# Patient Record
Sex: Male | Born: 1985 | Hispanic: Yes | Marital: Married | Smoking: Never smoker
Health system: Southern US, Community
[De-identification: ages and names within clinical notes are randomized; demographics above are authoritative.]

## PROBLEM LIST (undated history)

## (undated) DIAGNOSIS — K219 Gastro-esophageal reflux disease without esophagitis: Secondary | ICD-10-CM

## (undated) DIAGNOSIS — K625 Hemorrhage of anus and rectum: Secondary | ICD-10-CM

## (undated) DIAGNOSIS — I10 Essential (primary) hypertension: Secondary | ICD-10-CM

## (undated) HISTORY — DX: Gastro-esophageal reflux disease without esophagitis: K21.9

## (undated) HISTORY — DX: Essential (primary) hypertension: I10

## (undated) HISTORY — DX: Hemorrhage of anus and rectum: K62.5

## (undated) HISTORY — PX: OTHER SURGICAL HISTORY: SHX169

---

## 2014-07-31 ENCOUNTER — Emergency Department (HOSPITAL_COMMUNITY)
Admission: EM | Admit: 2014-07-31 | Discharge: 2014-07-31 | Disposition: A | Discharge status: Home / Self Care | Payer: Self-pay | Attending: Emergency Medicine | Admitting: Emergency Medicine

## 2014-07-31 ENCOUNTER — Emergency Department (HOSPITAL_COMMUNITY): Payer: Self-pay

## 2014-07-31 ENCOUNTER — Encounter (HOSPITAL_COMMUNITY): Payer: Self-pay | Admitting: Emergency Medicine

## 2014-07-31 DIAGNOSIS — R071 Chest pain on breathing: Secondary | ICD-10-CM | POA: Insufficient documentation

## 2014-07-31 DIAGNOSIS — R079 Chest pain, unspecified: Secondary | ICD-10-CM | POA: Insufficient documentation

## 2014-07-31 DIAGNOSIS — M549 Dorsalgia, unspecified: Secondary | ICD-10-CM | POA: Insufficient documentation

## 2014-07-31 DIAGNOSIS — Z79899 Other long term (current) drug therapy: Secondary | ICD-10-CM | POA: Insufficient documentation

## 2014-07-31 DIAGNOSIS — R0789 Other chest pain: Secondary | ICD-10-CM

## 2014-07-31 LAB — I-STAT CHEM 8, ED
BUN: 14 mg/dL (ref 6–23)
Calcium, Ion: 1.23 mmol/L (ref 1.12–1.23)
Chloride: 102 mEq/L (ref 96–112)
Creatinine, Ser: 1 mg/dL (ref 0.50–1.35)
GLUCOSE: 95 mg/dL (ref 70–99)
HEMATOCRIT: 50 % (ref 39.0–52.0)
HEMOGLOBIN: 17 g/dL (ref 13.0–17.0)
POTASSIUM: 3.8 meq/L (ref 3.7–5.3)
SODIUM: 142 meq/L (ref 137–147)
TCO2: 24 mmol/L (ref 0–100)

## 2014-07-31 LAB — I-STAT TROPONIN, ED: TROPONIN I, POC: 0 ng/mL (ref 0.00–0.08)

## 2014-07-31 MED ORDER — IBUPROFEN 600 MG PO TABS: 600.0000 mg | ORAL_TABLET | Freq: Four times a day (QID) | ORAL | Status: DC | PRN

## 2014-07-31 MED ORDER — IBUPROFEN 800 MG PO TABS
800.0000 mg | ORAL_TABLET | Freq: Once | ORAL | Status: AC
  Administered 2014-07-31: 800 mg via ORAL
  Filled 2014-07-31: qty 1

## 2014-07-31 NOTE — ED Provider Notes (Signed)
CSN: [id number]     Arrival date & time 07/31/14  1143 History   First MD Initiated Contact with Patient 07/31/14 1429     Chief Complaint  Patient presents with  . Chest Pain   Jorge Randall is a 28 yo hispanic M who presents w/CP. Patient has had this chest pain for about a year and a half, located on the left side of his chest, worse with left arm movement. However patient endorses in the last 24 hours his pain has become much worse. Pain is sharp in nature and radiates to his back. Patient endorses that with deep inspiration he feels like the pain is worse. Left chest wall is tender to touch. No history of trauma or anything that would cause this pain. 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 He's tried Tylenol with minimal relief.  He denies SOB, cough, fever, chills, N/V, diarrhea, constipation, hematemesis, dysuria, hematuria, sick contacts, or recent travel.   (Consider location/radiation/quality/duration/timing/severity/associated sxs/prior Treatment) Patient is a 28 y.o. male presenting with chest pain.  Chest Pain Pain location:  L chest and L lateral chest Pain quality: aching and sharp   Radiates to: left back. Pain radiates to the back: yes   Pain severity:  Severe Onset quality:  Gradual Duration: 18 months. Timing:  Constant Progression:  Worsening Chronicity:  Chronic Context: breathing, lifting, movement, raising an arm and at rest   Context: no trauma   Relieved by:  Nothing Worsened by:  Deep breathing and movement Ineffective treatments:  Rest Associated symptoms: back pain   Associated symptoms: no abdominal pain, no altered mental status, no cough, no diaphoresis, no dizziness, no fever, no headache, no nausea, no near-syncope, no palpitations, no shortness of breath, not vomiting and no weakness     History reviewed. No pertinent past medical history. History reviewed. No pertinent past surgical  history. No family history on file. History  Substance Use Topics  . Smoking status: Never Smoker   . Smokeless tobacco: Not on file  . Alcohol Use: No    Review of Systems  Constitutional: Negative for fever, chills and diaphoresis.  Respiratory: Negative for cough and shortness of breath.   Cardiovascular: Positive for chest pain. Negative for palpitations, leg swelling and near-syncope.  Gastrointestinal: Negative for nausea, vomiting, abdominal pain, diarrhea, constipation and abdominal distention.  Genitourinary: Negative for dysuria, frequency, flank pain and decreased urine volume.  Musculoskeletal: Positive for back pain.  Neurological: Negative for dizziness, speech difficulty, weakness, light-headedness and headaches.  All other systems reviewed and are negative.     Allergies  Review of patient's allergies indicates no known allergies.  Home Medications   Prior to Admission medications   Medication Sig Start Date End Date Taking? Authorizing Provider  ibuprofen (ADVIL,MOTRIN) 600 MG tablet Take 1 tablet (600 mg total) by mouth every 6 (six) hours as needed for moderate pain. 07/31/14   Sherian Maroon, MD   BP 142/85  Pulse 67  Temp(Src) 98 F (36.7 C) (Oral)  Resp 20  SpO2 100% Physical Exam  Nursing note and vitals reviewed. Constitutional: He is oriented to person, place, and time. He appears well-developed and well-nourished. No distress.  HENT:  Head: Normocephalic and atraumatic.  Eyes: Pupils are equal, round, and reactive to light.  Neck: Normal range of motion.  Cardiovascular: Normal rate, regular rhythm, normal heart sounds and intact distal pulses.  Exam reveals no gallop and no friction rub.  No murmur heard. Pulmonary/Chest: Effort normal and breath sounds normal. No respiratory distress. He has no wheezes. He has no rales. He exhibits tenderness (left pectoralis major, left lateral rib, left posterior shoulder blade).  Abdominal: Soft. Bowel sounds  are normal. He exhibits no distension and no mass. There is no tenderness. There is no rebound and no guarding.  Musculoskeletal: Normal range of motion. He exhibits tenderness (left pectoralis major). He exhibits no edema.  Lymphadenopathy:    He has no cervical adenopathy.  Neurological: He is alert and oriented to person, place, and time. No cranial nerve deficit.  Skin: Skin is warm and dry. He is not diaphoretic.    ED Course  Procedures (including critical care time) [place], ED  I-STAT CHEM 8, ED    Imaging Review Dg Chest 2 View  07/31/2014   CLINICAL DATA:  Chest pain.  EXAM: CHEST  2 VIEW  COMPARISON:  No prior.  FINDINGS: Mediastinum and hilar structures normal. Lungs are clear. Heart size normal. No pleural effusion or pneumothorax. No acute bony abnormality.  IMPRESSION: No acute cardiopulmonary disease.   Electronically Signed   By: Marcello Moores  Register   On: 07/31/2014 12:22     EKG Interpretation None      MDM   28 year old Hispanic male here with chest pain. Please see history of present illness for details. On exam patient in NAD, AF VSS. Patient has tenderness to palpation over his left pectoralis major, left lateral ribs as well as posterior upper left back. There is no ecchymosis or erythema to the area, no swelling, no warmth. Suspect he has inflammation of the area. Remainder of exam benign. History and exam not c/w ACS, pericarditis, PTX, pancreatitis, AAA, aortic dissection, or PNA.  Troponin 0, lites within normal limits, chest x-ray shows no acute cardiopulmonary disease. EKG w/NSR and no signs of arrhythmia or ischemia, normal intervals.   Stable for discharge home with Motrin 600 800 mg every 6-8 hours as needed for the next several days. Followup as outpatient PCP. Strict return precautions include shortness of breath, severe chest pain, especially accompanied by nausea, vomiting, and diaphoresis.  Final diagnoses:   Chest wall pain    Pt was seen under the supervision of Dr. Jeanell Sparrow.     Sherian Maroon, MD 07/31/14 [phone]

## 2014-07-31 NOTE — Discharge Instructions (Signed)
Dolor de la pared torácica °(Chest Wall Pain) °Dolor en la pared torácica es dolor en o alrededor de los huesos y músculos de su pecho. Podrán pasar hasta 6 semanas hasta que comience a mejorar. Puede demorar más tiempo si es físicamente activo en su trabajo y actividades.  °CAUSAS  °El dolor en el pecho puede aparecer sin motivo. No obstante, algunas causas pueden ser:  °· Una enfermedad viral como la gripe. °· Traumatismos. °· Tos. °· La práctica de ejercicios. °· Artritis. °· Fibromialgia °· Culebrilla. °INSTRUCCIONES PARA EL CUIDADO DOMICILIARIO °· Evite hacer actividad física extenuante. Trate de no esforzarse o realizar actividades que le causen dolor. Aquí se incluyen las actividades en las que usa los músculos del tórax, los abdominales y los músculos laterales, especialmente si debe levantar objetos pesados. °· Aplique hielo sobre la zona dolorida. °¨ Ponga el hielo en una bolsa plástica. °¨ Colóquese una toalla entre la piel y la bolsa de hielo. °¨ Deje la bolsa de hielo durante 15 a 20 minutos por hora, durante los primeros 2 días. °· Utilice los medicamentos de venta libre o de prescripción para el dolor, el malestar o la fiebre, según se lo indique el profesional que lo asiste. °SOLICITE ATENCIÓN MÉDICA DE INMEDIATO SI: °· El dolor aumenta o siente muchas molestias. °· Tiene fiebre. °· El dolor de pecho empeora. °· Desarrolla nuevos e inexplicables síntomas. °· Tiene náuseas o vómitos. °· Transpira o se siente mareado. °· Tiene tos con flema (esputo), o tose con sangre. °ESTÉ SEGURO QUE:  °· Comprende las instrucciones para el alta médica. °· Controlará su enfermedad. °· Solicitará atención médica de inmediato según las indicaciones. °Document Released: 01/10/2007 Document Revised: 02/21/2012 °ExitCare® Patient Information ©2015 [employer]. This information is not intended to replace advice given to you by your health care provider. Make sure you discuss any questions you have with your health care  provider. ° °

## 2014-07-31 NOTE — ED Notes (Signed)
Pt continues to be monitored by blood pressure, 5 lead, and pulse ox.

## 2014-07-31 NOTE — ED Notes (Signed)
Pt presents to department for evaluation of L sided chest pain. Pt does not speak english, interpreter phone used. Pt reports L sided chest pain and L hand numbness, onset last night while at home. 5/10 pain upon arrival to ED. Respirations unlabored, skin warm and dry. NAD.

## 2014-08-01 NOTE — ED Provider Notes (Signed)
28 y.o. Male with left side chest pain that appears to be chest wall with reproducibility.    I performed a history and physical examination of Jorge Randall and discussed his management with Dr. Tamala Julian.  I agree with the history, physical, assessment, and plan of care, with the following exceptions: None  I was present for the following procedures: None Time Spent in Critical Care of the patient: None Time spent in discussions with the patient and family: 5  [name]    Shaune Pollack, MD 08/01/14 [phone]

## 2014-11-03 ENCOUNTER — Emergency Department (HOSPITAL_COMMUNITY)
Admission: EM | Admit: 2014-11-03 | Discharge: 2014-11-03 | Disposition: A | Discharge status: Home / Self Care | Payer: Self-pay | Attending: Emergency Medicine | Admitting: Emergency Medicine

## 2014-11-03 ENCOUNTER — Encounter (HOSPITAL_COMMUNITY): Payer: Self-pay | Admitting: *Deleted

## 2014-11-03 ENCOUNTER — Emergency Department (HOSPITAL_COMMUNITY): Payer: Self-pay

## 2014-11-03 DIAGNOSIS — Y9389 Activity, other specified: Secondary | ICD-10-CM | POA: Insufficient documentation

## 2014-11-03 DIAGNOSIS — H578 Other specified disorders of eye and adnexa: Secondary | ICD-10-CM | POA: Insufficient documentation

## 2014-11-03 DIAGNOSIS — L03115 Cellulitis of right lower limb: Secondary | ICD-10-CM

## 2014-11-03 DIAGNOSIS — Y998 Other external cause status: Secondary | ICD-10-CM | POA: Insufficient documentation

## 2014-11-03 DIAGNOSIS — R112 Nausea with vomiting, unspecified: Secondary | ICD-10-CM | POA: Insufficient documentation

## 2014-11-03 DIAGNOSIS — R42 Dizziness and giddiness: Secondary | ICD-10-CM | POA: Insufficient documentation

## 2014-11-03 DIAGNOSIS — M25569 Pain in unspecified knee: Secondary | ICD-10-CM

## 2014-11-03 DIAGNOSIS — Y9289 Other specified places as the place of occurrence of the external cause: Secondary | ICD-10-CM | POA: Insufficient documentation

## 2014-11-03 DIAGNOSIS — W1839XA Other fall on same level, initial encounter: Secondary | ICD-10-CM | POA: Insufficient documentation

## 2014-11-03 LAB — CBC WITH DIFFERENTIAL/PLATELET
BASOS PCT: 0 % (ref 0–1)
Basophils Absolute: 0 10*3/uL (ref 0.0–0.1)
Eosinophils Absolute: 0.5 10*3/uL (ref 0.0–0.7)
Eosinophils Relative: 5 % (ref 0–5)
HCT: 42.1 % (ref 39.0–52.0)
HEMOGLOBIN: 14.9 g/dL (ref 13.0–17.0)
Lymphocytes Relative: 20 % (ref 12–46)
Lymphs Abs: 1.9 10*3/uL (ref 0.7–4.0)
MCH: 29.6 pg (ref 26.0–34.0)
MCHC: 35.4 g/dL (ref 30.0–36.0)
MCV: 83.7 fL (ref 78.0–100.0)
MONOS PCT: 7 % (ref 3–12)
Monocytes Absolute: 0.7 10*3/uL (ref 0.1–1.0)
NEUTROS ABS: 6.4 10*3/uL (ref 1.7–7.7)
Neutrophils Relative %: 67 % (ref 43–77)
Platelets: 172 10*3/uL (ref 150–400)
RBC: 5.03 MIL/uL (ref 4.22–5.81)
RDW: 12.8 % (ref 11.5–15.5)
WBC: 9.5 10*3/uL (ref 4.0–10.5)

## 2014-11-03 LAB — I-STAT CHEM 8, ED
BUN: 14 mg/dL (ref 6–23)
Calcium, Ion: 1.14 mmol/L (ref 1.12–1.23)
Chloride: 103 mEq/L (ref 96–112)
Creatinine, Ser: 1 mg/dL (ref 0.50–1.35)
Glucose, Bld: 110 mg/dL — ABNORMAL HIGH (ref 70–99)
HEMATOCRIT: 47 % (ref 39.0–52.0)
HEMOGLOBIN: 16 g/dL (ref 13.0–17.0)
POTASSIUM: 3.4 meq/L — AB (ref 3.7–5.3)
Sodium: 141 mEq/L (ref 137–147)
TCO2: 23 mmol/L (ref 0–100)

## 2014-11-03 LAB — I-STAT CG4 LACTIC ACID, ED: Lactic Acid, Venous: 1.1 mmol/L (ref 0.5–2.2)

## 2014-11-03 MED ORDER — KETOROLAC TROMETHAMINE 30 MG/ML IJ SOLN
30.0000 mg | Freq: Once | INTRAMUSCULAR | Status: AC
  Administered 2014-11-03: 30 mg via INTRAVENOUS

## 2014-11-03 MED ORDER — METOCLOPRAMIDE HCL 5 MG/ML IJ SOLN
10.0000 mg | Freq: Once | INTRAMUSCULAR | Status: AC
  Administered 2014-11-03: 10 mg via INTRAVENOUS
  Filled 2014-11-03: qty 2

## 2014-11-03 MED ORDER — DIPHENHYDRAMINE HCL 50 MG/ML IJ SOLN
25.0000 mg | Freq: Once | INTRAMUSCULAR | Status: AC
  Administered 2014-11-03: 25 mg via INTRAVENOUS
  Filled 2014-11-03: qty 1

## 2014-11-03 MED ORDER — KETOROLAC TROMETHAMINE 30 MG/ML IJ SOLN
60.0000 mg | Freq: Once | INTRAMUSCULAR | Status: DC
  Filled 2014-11-03: qty 2

## 2014-11-03 MED ORDER — DEXAMETHASONE SODIUM PHOSPHATE 10 MG/ML IJ SOLN
10.0000 mg | Freq: Once | INTRAMUSCULAR | Status: AC
Start: 2014-11-03 — End: 2014-11-03
  Administered 2014-11-03: 10 mg via INTRAVENOUS
  Filled 2014-11-03: qty 1

## 2014-11-03 MED ORDER — SODIUM CHLORIDE 0.9 % IV BOLUS (SEPSIS)
1000.0000 mL | Freq: Once | INTRAVENOUS | Status: AC
  Administered 2014-11-03: 1000 mL via INTRAVENOUS

## 2014-11-03 MED ORDER — CEPHALEXIN 500 MG PO CAPS: 500.0000 mg | ORAL_CAPSULE | Freq: Four times a day (QID) | ORAL | Status: DC

## 2014-11-03 NOTE — ED Provider Notes (Signed)
CSN: [id number]     Arrival date & time 11/03/14  1706 History   First MD Initiated Contact with Patient 11/03/14 1731     Chief Complaint  Patient presents with  . Headache  . Vomiting     (Consider location/radiation/quality/duration/timing/severity/associated sxs/prior Treatment) HPI History is obtained via translator. Patient is a 28 year old male who presents complaining of right knee pain, chills nausea vomiting and headache. His that he fell and struck his right knee proximally 4 days ago. Since that time he has developed worsening pain in his right knee, worse when he moves it. He says 2 days ago he began feeling nauseated, and began vomiting. The headache as well which he describes as throbbing and bilateral. He feels weak and dizzy when he stands up. He has not noticed any fever, but has felt chills. He currently complains of 7 out of 10 pain in his right knee, worse with range of motion.   History reviewed. No pertinent past medical history. History reviewed. No pertinent past surgical history. No family history on file. History  Substance Use Topics  . Smoking status: Never Smoker   . Smokeless tobacco: Not on file  . Alcohol Use: No    Review of Systems  Constitutional: Positive for chills and fatigue. Negative for fever.  Respiratory: Negative for shortness of breath.   Musculoskeletal: Positive for joint swelling and arthralgias.  All other systems reviewed and are negative.     Allergies  Review of patient's allergies indicates no known allergies.  Home Medications   Prior to Admission medications   Medication Sig Start Date End Date Taking? Authorizing Provider  cephALEXin (KEFLEX) 500 MG capsule Take 1 capsule (500 mg total) by mouth 4 (four) times daily. 11/03/14   Leata Mouse, MD  ibuprofen (ADVIL,MOTRIN) 600 MG tablet Take 1 tablet (600 mg total) by mouth every 6 (six) hours as needed for moderate pain. 07/31/14   Sherian Maroon, MD   BP 138/79 mmHg   Pulse 75  Temp(Src) 98.5 F (36.9 C) (Oral)  Resp 19  Wt 184 lb (83.462 kg)  SpO2 99% Physical Exam  Constitutional: He appears well-developed and well-nourished. No distress.  HENT:  Head: Normocephalic and atraumatic.  Eyes: Conjunctivae and EOM are normal. Pupils are equal, round, and reactive to light. Left eye exhibits discharge.  Neck: Normal range of motion. Neck supple. No JVD present.  Cardiovascular: Normal rate, regular rhythm, normal heart sounds and intact distal pulses.   Pulmonary/Chest: Effort normal and breath sounds normal. No respiratory distress. He has no wheezes.  Abdominal: Soft. Bowel sounds are normal. He exhibits no distension. There is no tenderness.  Musculoskeletal:       Right knee: He exhibits swelling, erythema (mild erythema and induration surrounding a small scab overlying the tibial tuberosity) and bony tenderness. He exhibits normal range of motion and no deformity. No tenderness found.  Nursing note and vitals reviewed.   ED Course  Procedures (including critical care time) Labs Review Labs Reviewed  I-STAT CHEM 8, ED - Abnormal; Notable for the following:    Potassium 3.4 (*)    Glucose, Bld 110 (*)    All other components within normal limits  CBC WITH DIFFERENTIAL  I-STAT CG4 LACTIC ACID, ED    Imaging Review Dg Knee 2 Views Right  11/03/2014   CLINICAL DATA:  Anterior knee pain status post trauma.  EXAM: RIGHT KNEE - 1-2 VIEW  COMPARISON:  None.  FINDINGS: No displaced fracture or dislocation. No aggressive  osseous lesion. No joint effusion. No radiopaque foreign body.  IMPRESSION: No acute or aggressive osseous finding of the right knee.   Electronically Signed   By: Carlos Levering M.D.   On: 11/03/2014 19:32     EKG Interpretation None      MDM   Final diagnoses:  Knee pain  Cellulitis of right lower extremity    28 year old male presenting with cellulitis of the right knee. Normal vital signs, no fever, no tachycardia, no  leukocytosis, doubt serious systemic infection. Hence mild erythema and tenderness over the right tibial tuberosity. There is no sign of erosive disease, fracture or malalignment on x-ray.  He also complains of a headache associated with nausea. He has a normal neurologic exam, normal coordination extraocular movements intact, remainder cranial nerves unremarkable. Patient has a normal gait. Headache cocktail relieved his pain. No history of trauma to the head. Do not believe imaging is warranted at this time and there is no meningismus on exam.   We'll discharge with protection for Bactrim for cellulitis and return precautions given.   Leata Mouse, MD 11/04/14 [phone]

## 2014-11-03 NOTE — ED Notes (Signed)
The pt is c/o rt lower extremity pain for 4 days.  He denies any injury and points to the   Rt knee area and below.

## 2014-11-03 NOTE — ED Notes (Signed)
Pt taken to x-ray by Richardson Dopp.

## 2014-11-03 NOTE — ED Notes (Signed)
The pt speaks little english

## 2014-11-03 NOTE — ED Provider Notes (Signed)
I saw and evaluated the patient, reviewed the resident's note and I agree with the findings and plan.  Patient with mild right knee tenderness over the anterior tibial tuberosity only, with the right knee joint itself nontender.   Babette Relic, MD 11/07/14 [phone]

## 2014-11-03 NOTE — Discharge Instructions (Signed)
Celulitis (Celulitis)  La celulitis es una infeccin de la piel y del tejido que se encuentra debajo de la piel. El rea infectada generalmente est de color rojo y duele. Ocurre con ms frecuencia en los brazos y en las piernas. [place] los antibiticos tal como se le indic. Finalice el [place], aunque comience a Sports administrator.  Mantenga el brazo o la pierna infectada elevada.  Aplique paos calientes en la zona hasta 4 veces al da.  Tome slo los medicamentos que le haya indicado el mdico.  [place]umpla con los controles mdicos segn las indicaciones. SOLICITE AYUDA SI:  Observa una lnea roja en la piel que sale desde la herida.  El rea roja se extiende o se vuelve de color oscuro.  El hueso o la articulacin que se encuentran por debajo de la zona infectada le duelen despus de que la piel se [place].  La infeccin se repite en la misma zona o en una zona diferente.  Tiene un bulto inflamado (hinchado) en la zona infectada.  Aparecen nuevos sntomas.  Tiene fiebre. SOLICITE AYUDA DE INMEDIATO SI:   Se siente muy somnoliento.  Vomita o tiene diarrea.  Se siente enfermo y tiene [employer]. ASEGRESE DE QUE:   Comprende estas instrucciones.  Controlar su enfermedad.  Solicitar ayuda de inmediato si no mejora o si empeora. Document Released: 05/19/2010 Document Revised: 04/15/2014 [facility] Patient Information 2015 [place]. This information is not intended to replace advice given to you by your health care provider. Make sure you discuss any questions you have with your health care provider.

## 2015-11-12 IMAGING — CR DG KNEE 1-2V*R*
2 series · 2 of 2 positions shown · non-contrast
Comparison: None.

CLINICAL DATA: Anterior knee pain status post trauma.

EXAM:
RIGHT KNEE - 1-2 VIEW

[knee ap]
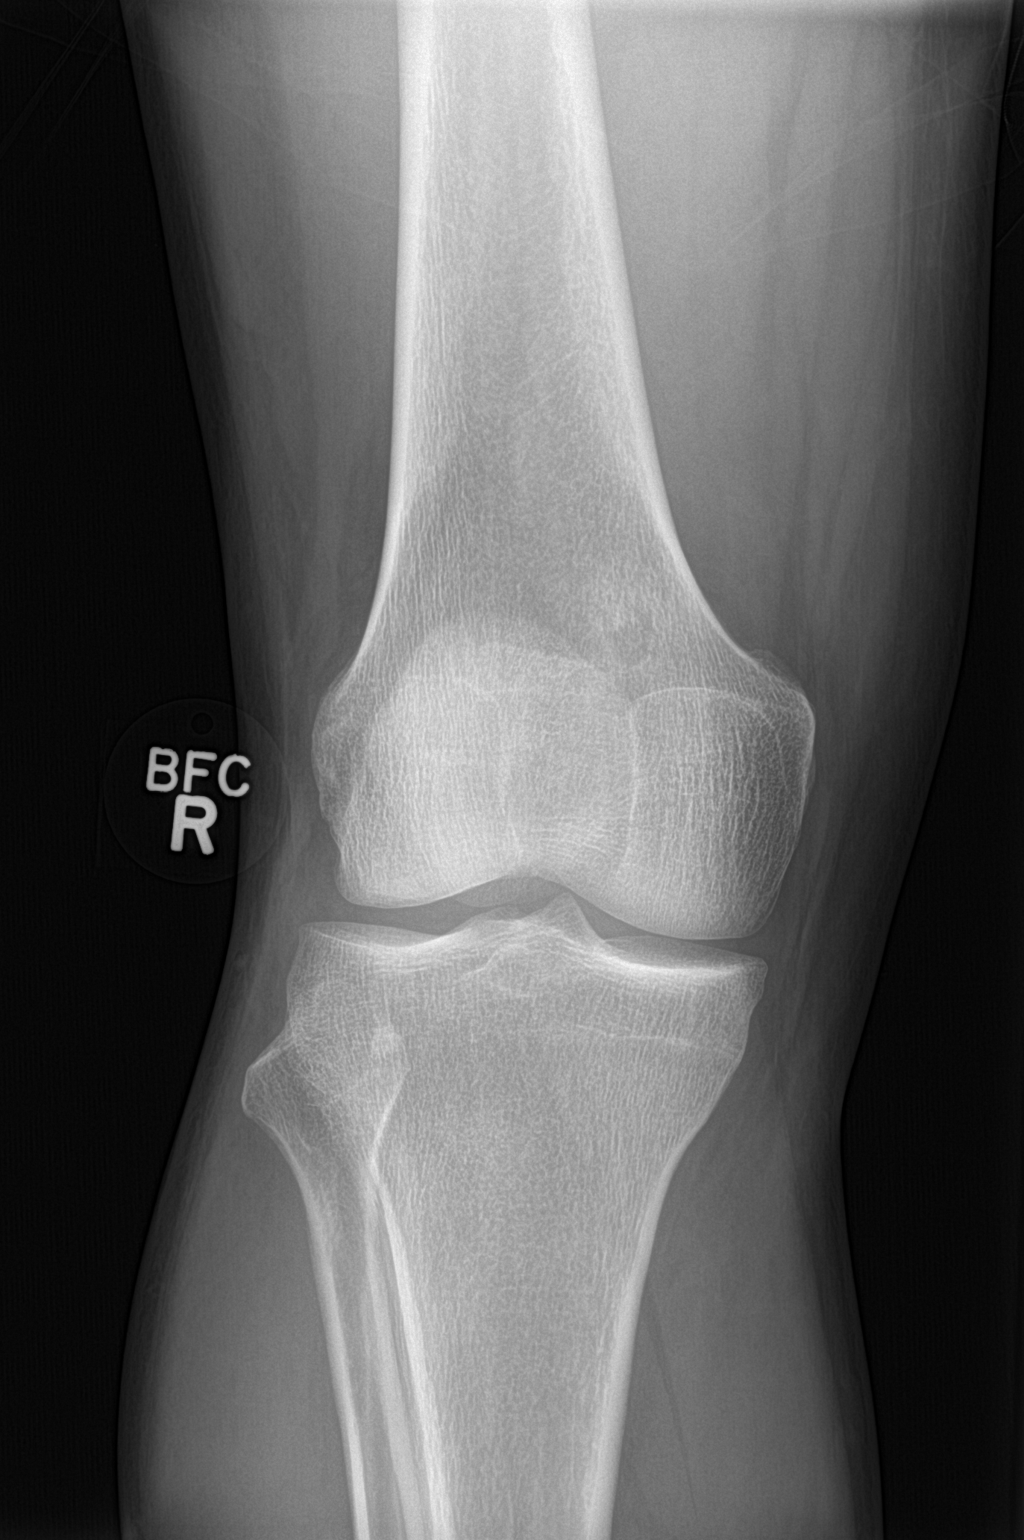

[knee lat]
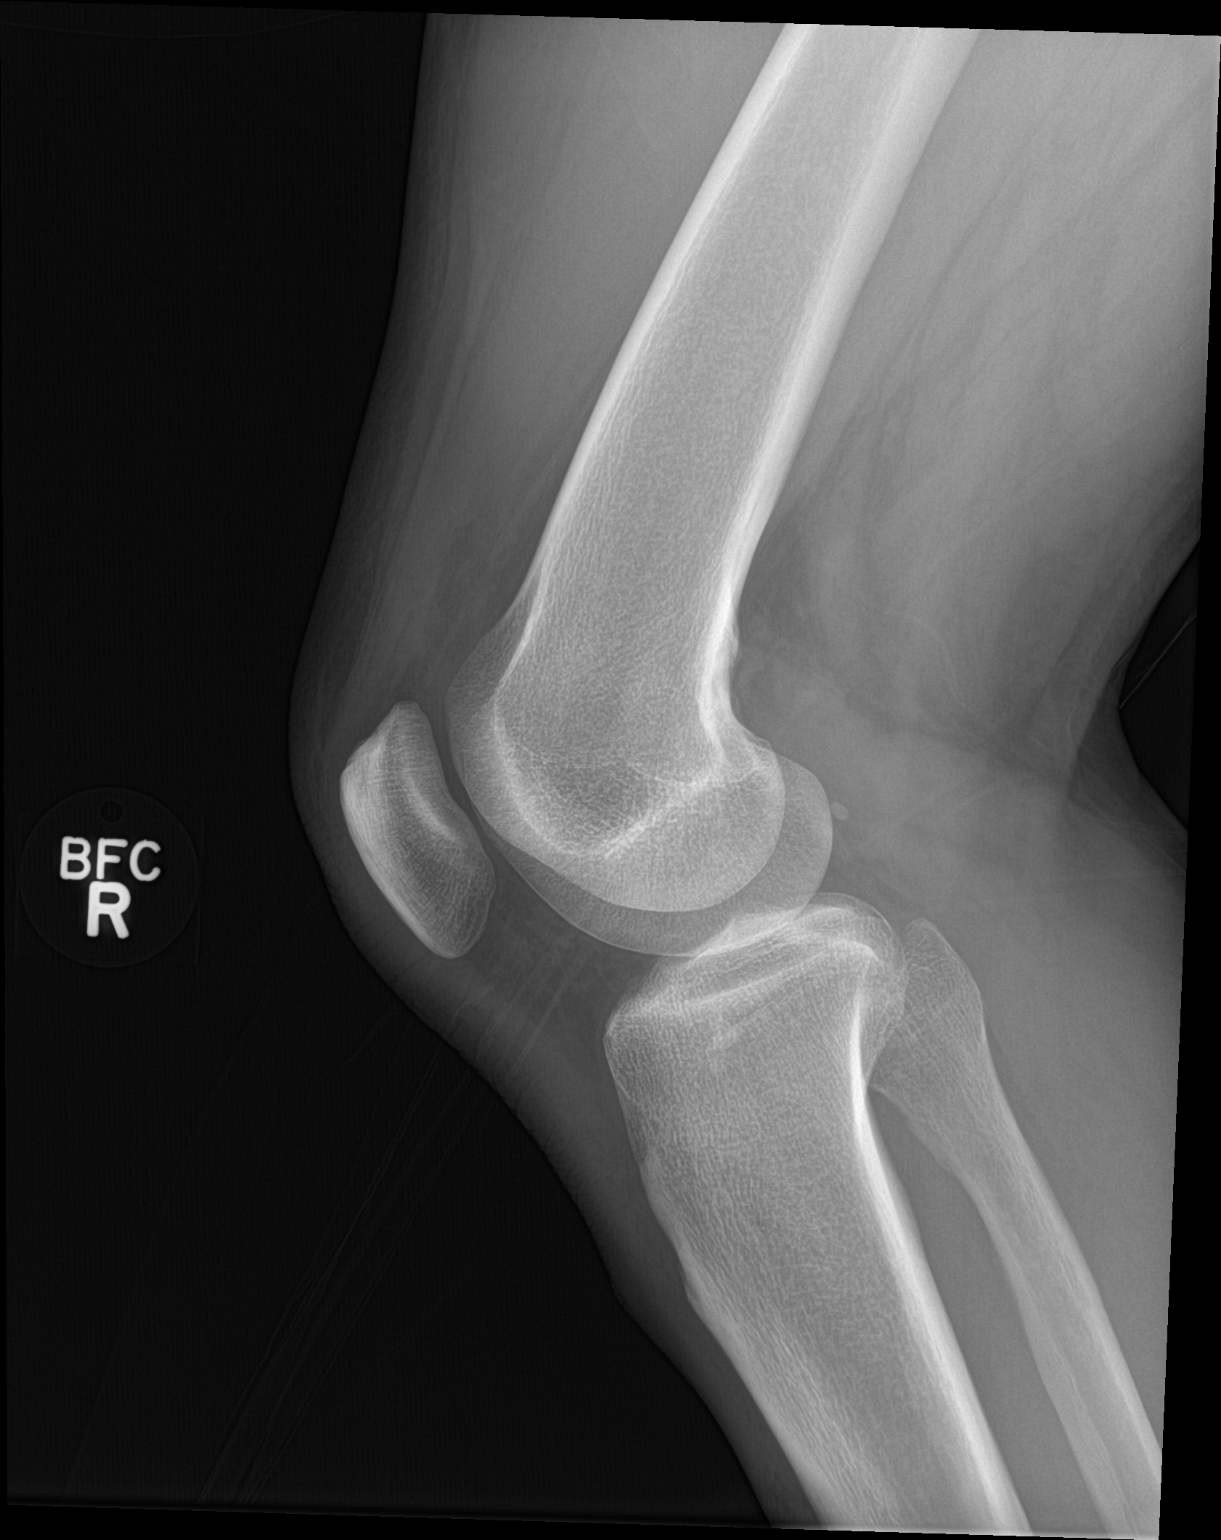

[2 of 2 positions shown; findings below may reference images not displayed]

FINDINGS: No displaced fracture or dislocation. No aggressive osseous lesion.
No joint effusion. No radiopaque foreign body.
IMPRESSION: No acute or aggressive osseous finding of the right knee.

## 2017-07-21 ENCOUNTER — Other Ambulatory Visit: Payer: Self-pay | Admitting: Nurse Practitioner

## 2017-07-21 DIAGNOSIS — R52 Pain, unspecified: Secondary | ICD-10-CM

## 2017-07-22 ENCOUNTER — Other Ambulatory Visit: Payer: Self-pay | Admitting: Nurse Practitioner

## 2017-07-22 DIAGNOSIS — R109 Unspecified abdominal pain: Secondary | ICD-10-CM

## 2017-07-25 ENCOUNTER — Other Ambulatory Visit: Payer: Self-pay

## 2017-07-25 ENCOUNTER — Other Ambulatory Visit: Payer: Self-pay | Admitting: Nurse Practitioner

## 2017-07-25 DIAGNOSIS — R1084 Generalized abdominal pain: Secondary | ICD-10-CM

## 2017-07-26 ENCOUNTER — Other Ambulatory Visit: Payer: No Typology Code available for payment source

## 2017-07-26 ENCOUNTER — Ambulatory Visit
Admission: RE | Admit: 2017-07-26 | Discharge: 2017-07-26 | Disposition: A | Discharge status: Home / Self Care | Payer: No Typology Code available for payment source | Source: Ambulatory Visit | Attending: Nurse Practitioner | Admitting: Nurse Practitioner

## 2017-07-26 DIAGNOSIS — R1084 Generalized abdominal pain: Secondary | ICD-10-CM

## 2017-07-28 ENCOUNTER — Encounter: Payer: Self-pay | Admitting: Gastroenterology

## 2017-08-03 ENCOUNTER — Emergency Department (HOSPITAL_COMMUNITY): Payer: Self-pay

## 2017-08-03 ENCOUNTER — Emergency Department (HOSPITAL_COMMUNITY)
Admission: EM | Admit: 2017-08-03 | Discharge: 2017-08-03 | Disposition: A | Discharge status: Home / Self Care | Payer: Self-pay | Attending: Physician Assistant | Admitting: Physician Assistant

## 2017-08-03 ENCOUNTER — Encounter (HOSPITAL_COMMUNITY): Payer: Self-pay | Admitting: Emergency Medicine

## 2017-08-03 DIAGNOSIS — K625 Hemorrhage of anus and rectum: Secondary | ICD-10-CM | POA: Insufficient documentation

## 2017-08-03 DIAGNOSIS — R109 Unspecified abdominal pain: Secondary | ICD-10-CM | POA: Insufficient documentation

## 2017-08-03 DIAGNOSIS — Z79899 Other long term (current) drug therapy: Secondary | ICD-10-CM | POA: Insufficient documentation

## 2017-08-03 LAB — COMPREHENSIVE METABOLIC PANEL
ALK PHOS: 70 U/L (ref 38–126)
ALT: 32 U/L (ref 17–63)
ANION GAP: 6 (ref 5–15)
AST: 33 U/L (ref 15–41)
Albumin: 4.1 g/dL (ref 3.5–5.0)
BUN: 15 mg/dL (ref 6–20)
CALCIUM: 9.2 mg/dL (ref 8.9–10.3)
CHLORIDE: 107 mmol/L (ref 101–111)
CO2: 27 mmol/L (ref 22–32)
CREATININE: 1.03 mg/dL (ref 0.61–1.24)
Glucose, Bld: 91 mg/dL (ref 65–99)
Potassium: 3.7 mmol/L (ref 3.5–5.1)
Sodium: 140 mmol/L (ref 135–145)
Total Bilirubin: 0.8 mg/dL (ref 0.3–1.2)
Total Protein: 8 g/dL (ref 6.5–8.1)

## 2017-08-03 LAB — CBC
HCT: 42.1 % (ref 39.0–52.0)
Hemoglobin: 15.3 g/dL (ref 13.0–17.0)
MCH: 29.8 pg (ref 26.0–34.0)
MCHC: 36.3 g/dL — ABNORMAL HIGH (ref 30.0–36.0)
MCV: 82.1 fL (ref 78.0–100.0)
PLATELETS: 167 10*3/uL (ref 150–400)
RBC: 5.13 MIL/uL (ref 4.22–5.81)
RDW: 12.7 % (ref 11.5–15.5)
WBC: 7.9 10*3/uL (ref 4.0–10.5)

## 2017-08-03 LAB — LIPASE, BLOOD: LIPASE: 43 U/L (ref 11–51)

## 2017-08-03 MED ORDER — IOPAMIDOL (ISOVUE-300) INJECTION 61%
100.0000 mL | Freq: Once | INTRAVENOUS | Status: AC | PRN
  Administered 2017-08-03: 100 mL via INTRAVENOUS

## 2017-08-03 NOTE — ED Provider Notes (Signed)
[facility] DEPT Provider Note   CSN: [id number] Arrival date & time: 08/03/17  1825     History   Chief Complaint Chief Complaint  Patient presents with  . Abdominal Pain  . Rectal Bleeding    HPI Jorge Randall is a 31 y.o. male.  HPI   Patient is a 31 year old male Spanish seeking presenting with abdominal pain and rectal bleeding. Patient reports that he has been having blood in his stools for last 3-4 months. He had an a  ultrasound week ago that showed nothing concerning. He reports that he has still had several episodes of bleeding while stooling. It is bright red. No nausea no vomiting no dark black stools. He reports his stools are hard.    History reviewed. No pertinent past medical history.  There are no active problems to display for this patient.   History reviewed. No pertinent surgical history.     Home Medications    Prior to Admission medications   Medication Sig Start Date End Date Taking? Authorizing Provider  omeprazole (PRILOSEC OTC) 20 MG tablet Take 20 mg by mouth daily.   Yes [provider]    Family History No family history on file.  Social History Social History  Substance Use Topics  . Smoking status: Never Smoker  . Smokeless tobacco: Never Used  . Alcohol use No     Allergies   Patient has no known allergies.   Review of Systems Review of Systems  Constitutional: Negative for activity change.  Respiratory: Negative for shortness of breath.   Cardiovascular: Negative for chest pain.  Gastrointestinal: Positive for abdominal pain and blood in stool. Negative for constipation.     Physical Exam Updated Vital Signs BP 123/87   Pulse (!) 58   Temp 98.7 F (37.1 C) (Oral)   Resp 14   Ht 5\' 5"  (1.651 m)   Wt 84.8 kg (187 lb)   SpO2 100%   BMI 31.12 kg/m   Physical Exam  Constitutional: He is oriented to person, place, and time. He appears well-nourished.  HENT:  Head: Normocephalic.  Eyes:  Conjunctivae are normal.  Cardiovascular: Normal rate.   Pulmonary/Chest: Effort normal and breath sounds normal. No respiratory distress.  Abdominal: Soft. He exhibits no distension. There is tenderness.  Lower quadrant abdominal pain on exam.  Genitourinary:  Genitourinary Comments: No fissure or hemorhoids, no stool , no blood. Chaperone present  Neurological: He is oriented to person, place, and time.  Skin: Skin is warm and dry. He is not diaphoretic.  Psychiatric: He has a normal mood and affect. His behavior is normal.     ED Treatments / Results  Labs (all labs ordered are listed, but only abnormal results are displayed) Labs Reviewed  CBC - Abnormal; Notable for the following:       Result Value   MCHC 36.3 (*)    All other components within normal limits  LIPASE, BLOOD  COMPREHENSIVE METABOLIC PANEL  URINALYSIS, ROUTINE W REFLEX MICROSCOPIC    EKG  EKG Interpretation None       Radiology Ct Abdomen Pelvis W Contrast  Result Date: 08/03/2017 CLINICAL DATA:  Abdominal pain. Diverticulitis suspected. Blood in stool. EXAM: CT ABDOMEN AND PELVIS WITH CONTRAST TECHNIQUE: Multidetector CT imaging of the abdomen and pelvis was performed using the standard protocol following bolus administration of intravenous contrast. CONTRAST:  138mL ISOVUE-300 IOPAMIDOL (ISOVUE-300) INJECTION 61% COMPARISON:  Ultrasound 07/26/2017 FINDINGS: Lower chest: Breathing motion artifact. No consolidation. No pleural fluid. Hepatobiliary:  Decreased hepatic density consistent with steatosis. No focal lesion. Gallbladder physiologically distended, no calcified stone. No biliary dilatation. Pancreas: No ductal dilatation or inflammation. Spleen: Normal in size without focal abnormality. Adrenals/Urinary Tract: Adrenal glands are unremarkable. Kidneys are normal, without renal calculi, focal lesion, or hydronephrosis. Bladder is unremarkable. Stomach/Bowel: No bowel wall thickening or evidence of  inflammation. Stomach is nondistended and not well assessed. There is fecalization of distal small bowel contents. Moderate colonic stool burden. No significant diverticular disease. Normal appendix. No terminal ileal inflammation. Vascular/Lymphatic: No significant vascular findings are present. No enlarged abdominal or pelvic lymph nodes. Reproductive: Prostate is unremarkable. Other: No free air, free fluid, or intra-abdominal fluid collection. Musculoskeletal: There are no acute or suspicious osseous abnormalities. IMPRESSION: 1. No bowel inflammation, no diverticular disease. 2. Moderate diffuse colonic stool burden with fecalization of distal small bowel contents suggesting slow transit. 3. Hepatic steatosis. Electronically Signed   By: Jeb Levering M.D.   On: 08/03/2017 22:43    Procedures Procedures (including critical care time)  Medications Ordered in ED Medications  iopamidol (ISOVUE-300) 61 % injection 100 mL (100 mLs Intravenous Contrast Given 08/03/17 2220)     Initial Impression / Assessment and Plan / ED Course  I have reviewed the triage vital signs and the nursing notes.  Pertinent labs & imaging results that were available during my care of the patient were reviewed by me and considered in my medical decision making (see chart for details).    Very well-appearing 31 year old male presenting with abdominal pain and blood in his stools. Patient already had workup with negative ultrasound and regular labs done as an outpatient. Given the tenderness on exam we'll get CT abdomen pelvis. Patient has follow-up with gastroenterology one week from now.  Final Clinical Impressions(s) / ED Diagnoses   Final diagnoses:  Rectal bleeding    New Prescriptions Discharge Medication List as of 08/03/2017 10:51 PM       Macarthur Critchley, MD 08/03/17 2340

## 2017-08-03 NOTE — ED Notes (Signed)
Patient transported to CT 

## 2017-08-03 NOTE — ED Triage Notes (Signed)
Daughter/translator stated, He has had a stomach pain and blood in stool for 3-4 months.  He had blood work and [place] a week ago.

## 2017-08-03 NOTE — Discharge Instructions (Signed)
You labs were normal, your vital signs are stable. We want you to follow up with GI as planned.   Please return with any dizziness or increased in bleeding or other concerns.

## 2017-08-03 NOTE — ED Notes (Signed)
ED Provider at bedside. 

## 2017-08-03 NOTE — ED Notes (Signed)
Pt departed in NAD, refused use of wheelchair.  

## 2017-08-05 ENCOUNTER — Encounter (HOSPITAL_COMMUNITY): Payer: Self-pay | Admitting: *Deleted

## 2017-08-10 ENCOUNTER — Other Ambulatory Visit (INDEPENDENT_AMBULATORY_CARE_PROVIDER_SITE_OTHER): Payer: Self-pay

## 2017-08-10 ENCOUNTER — Encounter: Payer: Self-pay | Admitting: Gastroenterology

## 2017-08-10 ENCOUNTER — Ambulatory Visit (INDEPENDENT_AMBULATORY_CARE_PROVIDER_SITE_OTHER): Payer: Self-pay | Admitting: Gastroenterology

## 2017-08-10 VITALS — BP 126/82 | HR 80 | Ht 65.0 in | Wt 180.9 lb

## 2017-08-10 DIAGNOSIS — K5900 Constipation, unspecified: Secondary | ICD-10-CM

## 2017-08-10 DIAGNOSIS — R52 Pain, unspecified: Secondary | ICD-10-CM

## 2017-08-10 DIAGNOSIS — R1030 Lower abdominal pain, unspecified: Secondary | ICD-10-CM

## 2017-08-10 LAB — TSH: TSH: 1.65 u[IU]/mL (ref 0.35–4.50)

## 2017-08-10 NOTE — Progress Notes (Signed)
Initial assessment and plans reviewed. Jorge Randall, make sure he follows up with you if his problems have not resolved. Thanks

## 2017-08-10 NOTE — Progress Notes (Signed)
     08/10/2017 Jorge Randall [id number] 02/19/1986   HISTORY OF PRESENT ILLNESS:  A 31 year old male who is new to our practice. He is primarily Spanish-speaking so the visit was performed via live interpreter. He tells me that he has been experiencing constipation on and off for several years.  Feels that it has become worse over the past 6 months. He says that when he eats he feels very full and develops discomfort/cramping his lower abdomen. Says that he usually has a bowel movement once a day, but has very hard stool at times. Lower abdominal cramping is usually relieved by bowel movement. He reports recently seeing small amounts of bright red blood with bowel movements, particularly when he feels that he is more constipated. Denies any rectal pain or discomfort. CT scan of the abdomen and pelvis just one week ago showed hepatic steatosis and moderate diffuse colonic stool burden with fecalization of distal small bowel contents suggesting slow transit. CBC, CMP, lipase were all unremarkable.   History reviewed. No pertinent past medical history. Past Surgical History:  Procedure Laterality Date  . none      reports that he has never smoked. He has never used smokeless tobacco. He reports that he drinks alcohol. He reports that he does not use drugs. family history is not on file. No Known Allergies    Outpatient Encounter Prescriptions as of 08/10/2017  Medication Sig  . omeprazole (PRILOSEC OTC) 20 MG tablet Take 20 mg by mouth daily.  [name] ibuprofen (ADVIL,MOTRIN) 600 MG tablet Take 1 tablet (600 mg total) by mouth every 6 (six) hours as needed for moderate pain. (Patient not taking: Reported on 08/10/2017)  . [DISCONTINUED] cephALEXin (KEFLEX) 500 MG capsule Take 1 capsule (500 mg total) by mouth 4 (four) times daily.   No facility-administered encounter medications on file as of 08/10/2017.      REVIEW OF SYSTEMS  : All other systems reviewed and negative except where noted in the  History of Present Illness.   PHYSICAL EXAM: BP 126/82   Pulse 80   Ht 5\' 5"  (1.651 m)   Wt 180 lb 14.4 oz (82.1 kg)   BMI 30.10 kg/m  General: Well developed Hispanic male in no acute distress Head: Normocephalic and atraumatic Eyes:  Sclerae anicteric, conjunctiva pink. Ears: Normal auditory acuity Lungs: Clear throughout to auscultation; no increased WOB. Heart: Regular rate and rhythm; no M/R/G. Abdomen: Soft, non-distended.  Normal bowel sounds.  Non-tender. Musculoskeletal: Symmetrical with no gross deformities  Skin: No lesions on visible extremities Extremities: No edema  Neurological: Alert oriented x 4, grossly non-focal Psychological:  Alert and cooperative. Normal mood and affect  ASSESSMENT AND PLAN: *31 year old male with complaints of chronic constipation and some associated lower abdominal pain/cramping and small amounts of rectal bleeding.  CT scan shows stool throughout the colon.  I have asked him to increase his fluid intake and eat several fruits and vegetables, especially "P" fruits.  Will also start Miralax daily and increase to BID if needed.  He will call back in 2-3 weeks with an update.  Will check TSH as well.   CC:  No ref. provider found

## 2017-08-10 NOTE — Patient Instructions (Signed)
If you are age 32 or older, your body mass index should be between 23-30. Your Body mass index is 30.1 kg/m. If this is out of the aforementioned range listed, please consider follow up with your Primary Care Provider.  If you are age 55 or younger, your body mass index should be between 19-25. Your Body mass index is 30.1 kg/m. If this is out of the aformentioned range listed, please consider follow up with your Primary Care Provider.   Your physician has requested that you go to the basement for the following lab work before leaving today: TSH  Increase fluid intake. Eat fresh vegetable and fruit ie pears, peaches, and plums.  Start Miralax (Polyethylene Glycol) daily.  Call with an update in 2-3 weeks.  Thank you for choosing me and [facility].  Alonza Bogus, PA-C _________________________________________________________  Si tiene 65 aos o ms, su ndice de masa corporal debera estar entre 23-30. Su ndice de masa corporal es 30.1 kg / m. Si esto est fuera del rango mencionado anteriormente, considere Optometrist un seguimiento con su Proveedor de [employer].   Si tienes 64 aos o menos, tu ndice de [employer] corporal debera estar entre 19-25. Su ndice de masa corporal es 30.1 kg / m. Si esto est fuera del rango mencionado mencionado, considere Optometrist un seguimiento con su Proveedor de [employer].  Su mdico le ha pedido que vaya al stano para el siguiente trabajo de laboratorio antes de partir hoy: TSH   Aumenta la ingesta de lquidos. Coma verduras y frutas frescas, es decir, peras, melocotones y ciruelas.   Comience Miralax (polietilenglicol) diariamente.   Llamar con Ardelia Mems actualizacin en 2-3 semanas.   Gracias por elegirme a m y a [facility].   Alonza Bogus, PA-C

## 2019-03-23 IMAGING — CT CT ABD-PELV W/ CM
2 of 4 series · 16 of 46 positions shown, 18 images · IV contrast (APPLIED)
Comparison: Ultrasound 07/26/2017

CLINICAL DATA: Abdominal pain. Diverticulitis suspected. Blood in
stool.

EXAM:
CT ABDOMEN AND PELVIS WITH CONTRAST
TECHNIQUE: Multidetector CT imaging of the abdomen and pelvis was performed
using the standard protocol following bolus administration of
intravenous contrast.
CONTRAST:  100mL 3YHMYR-WLL IOPAMIDOL (3YHMYR-WLL) INJECTION 61%

[Series 3: abdomen 5.0 · axial · 0.72mm/px · z∈[-573,-133]mm · 13 of 100 slices shown, 15 images]
[im 6/100  soft-tissue]
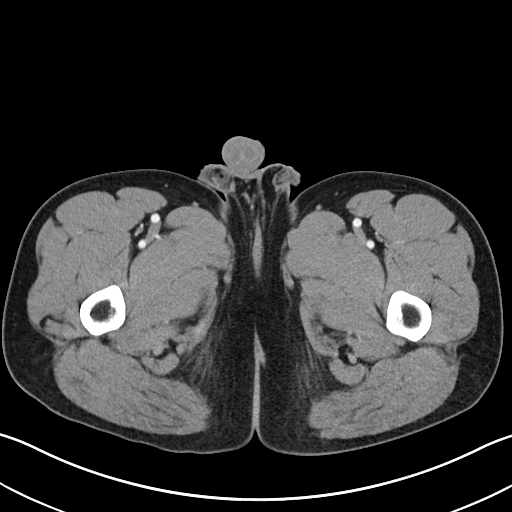
[im 6/100  bone]
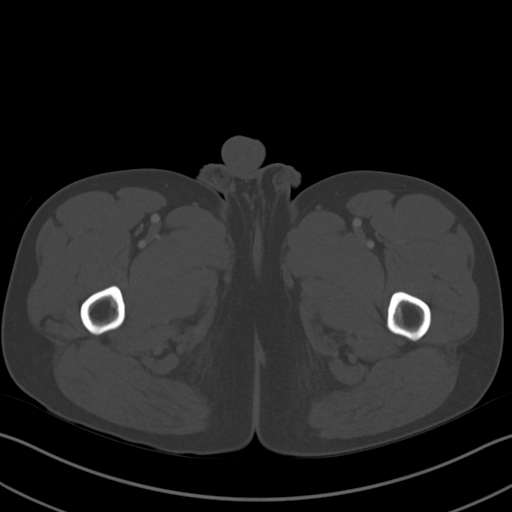
[im 12/100  soft-tissue]
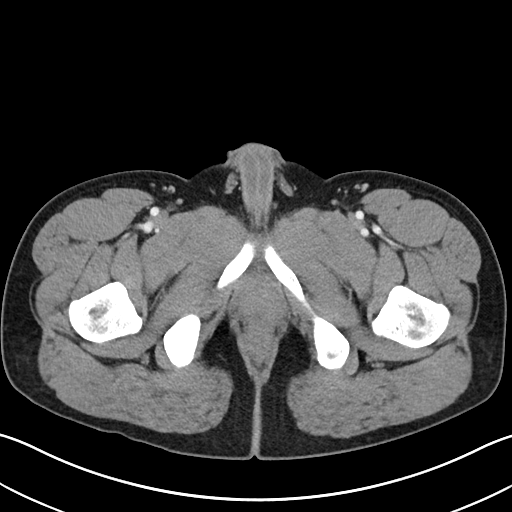
[im 23/100  soft-tissue]
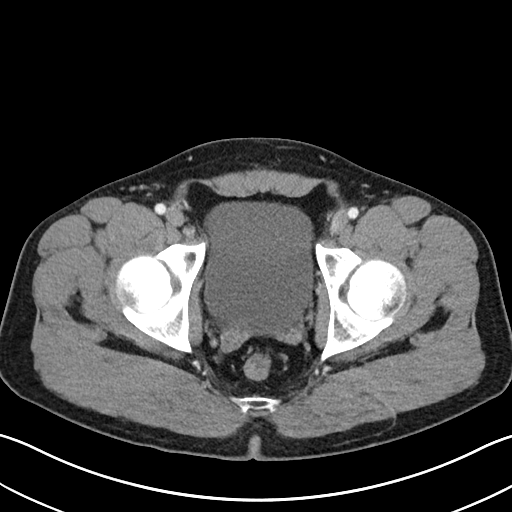
[im 28/100  soft-tissue]
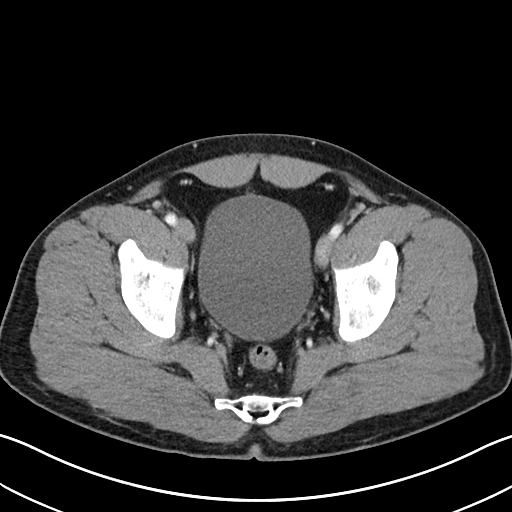
[im 34/100  soft-tissue]
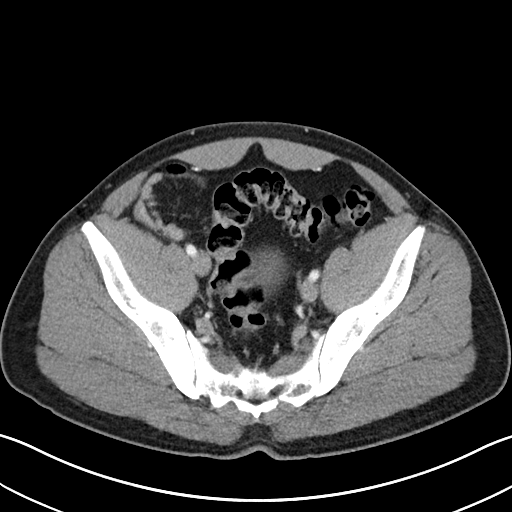
[im 45/100  soft-tissue]
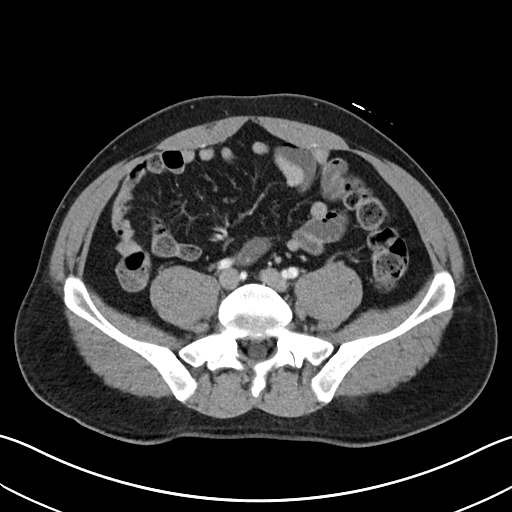
[im 50/100  soft-tissue]
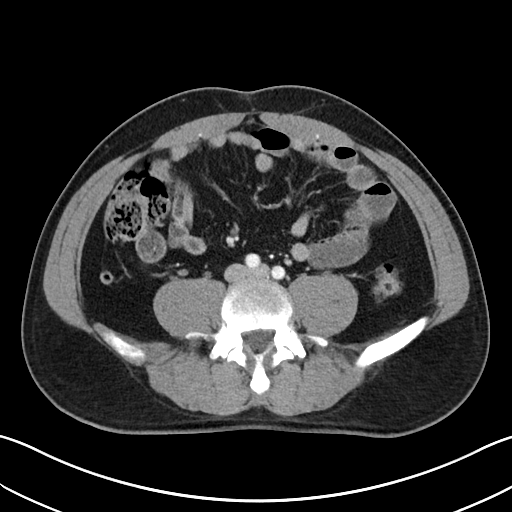
[im 56/100  soft-tissue]
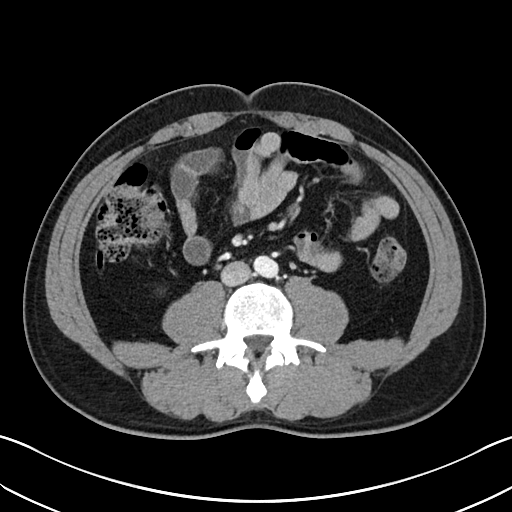
[im 67/100  soft-tissue]
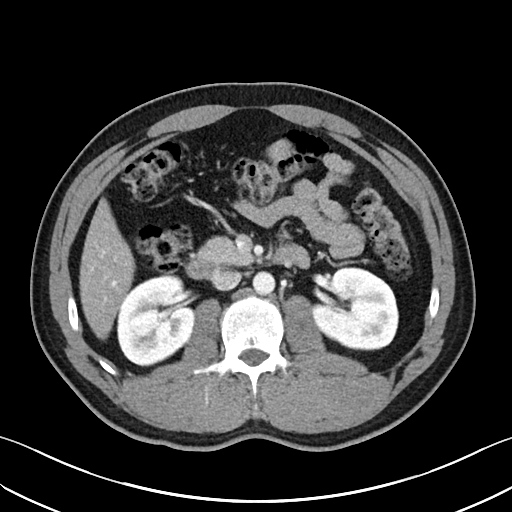
[im 67/100  bone]
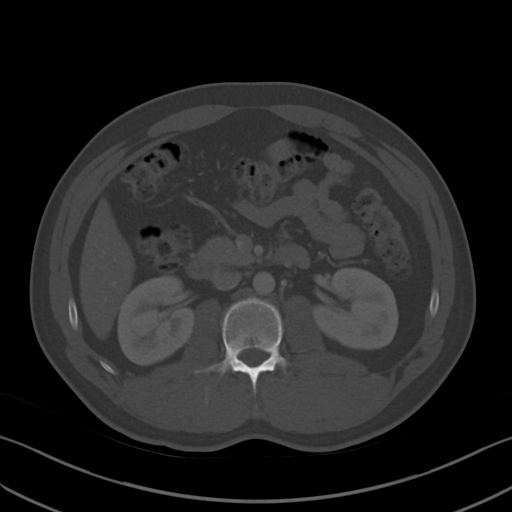
[im 72/100  soft-tissue]
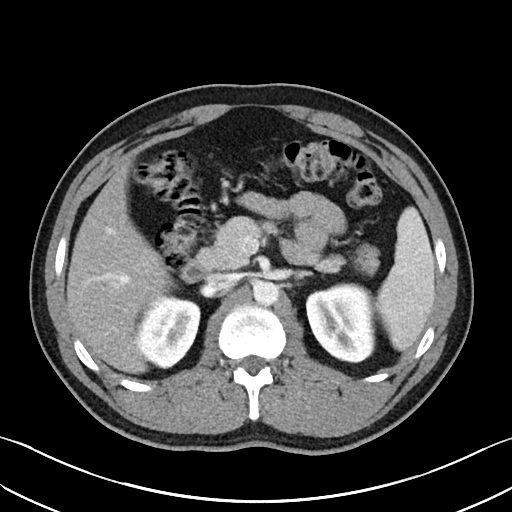
[im 78/100  soft-tissue]
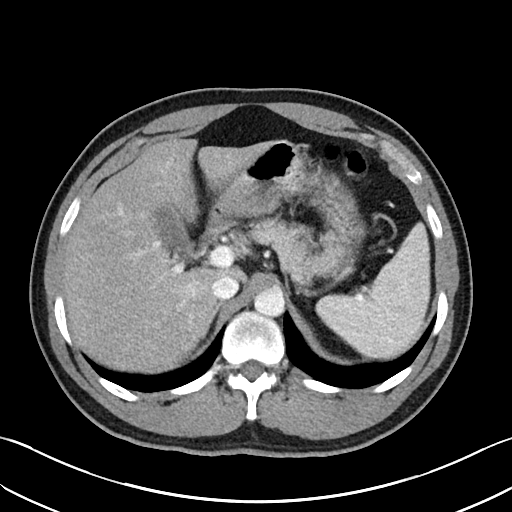
[im 89/100  soft-tissue]
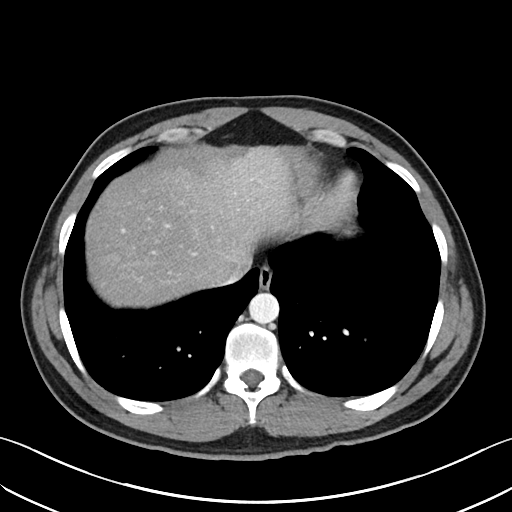
[im 94/100  soft-tissue]
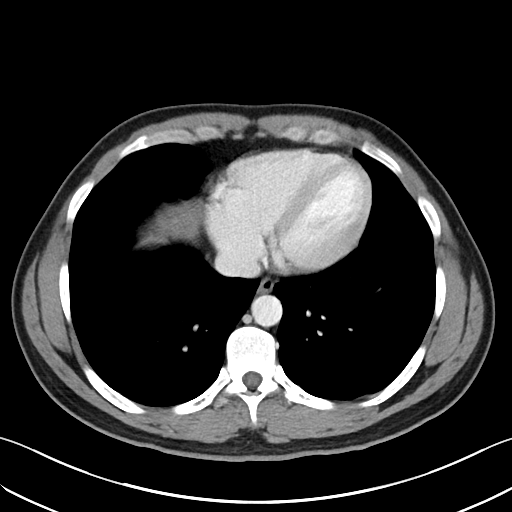

[Series 6: abdomen 3.0 mpr cor · coronal · 0.74mm/px · 3 of 101 slices shown]
[im 34/101  soft-tissue]
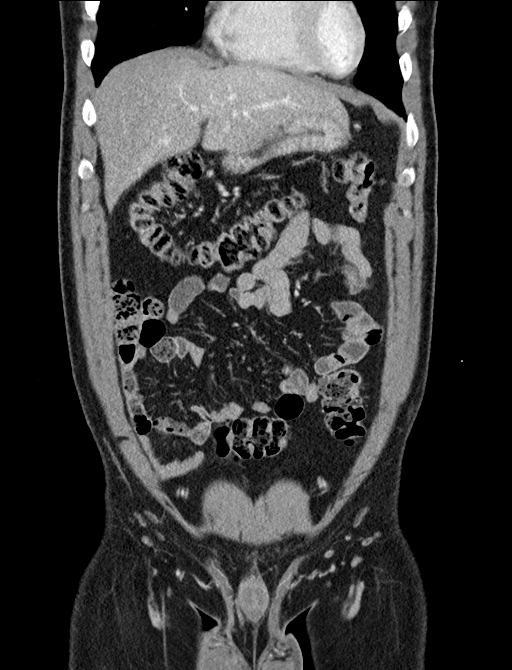
[im 45/101  soft-tissue]
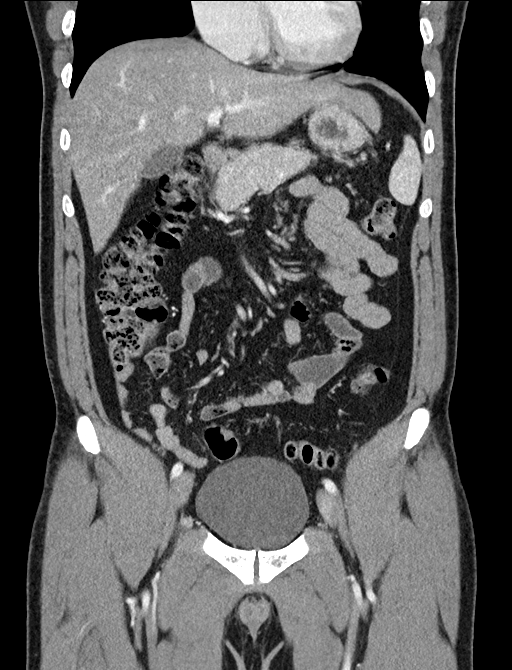
[im 56/101  soft-tissue]
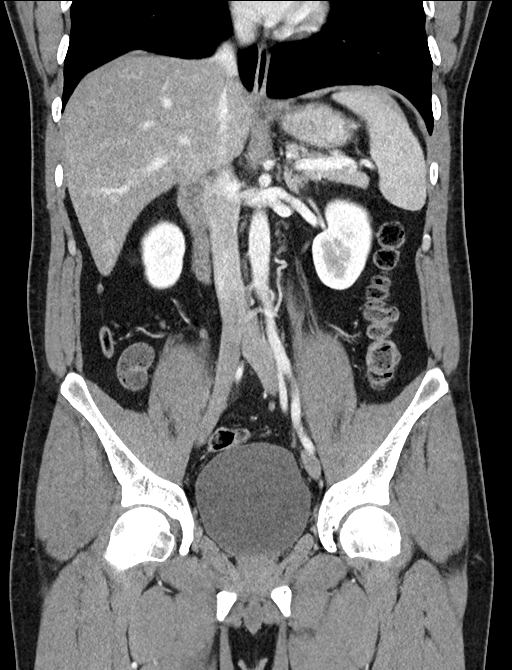

[16 of 46 positions shown; findings below may reference images not displayed]

FINDINGS: Lower chest: Breathing motion artifact. No consolidation. No pleural
fluid.

Hepatobiliary: Decreased hepatic density consistent with steatosis.
No focal lesion. Gallbladder physiologically distended, no calcified
stone. No biliary dilatation.

Pancreas: No ductal dilatation or inflammation.

Spleen: Normal in size without focal abnormality.

Adrenals/Urinary Tract: Adrenal glands are unremarkable. Kidneys are
normal, without renal calculi, focal lesion, or hydronephrosis.
Bladder is unremarkable.

Stomach/Bowel: No bowel wall thickening or evidence of inflammation.
Stomach is nondistended and not well assessed. There is fecalization
of distal small bowel contents. Moderate colonic stool burden. No
significant diverticular disease. Normal appendix. No terminal ileal
inflammation.

Vascular/Lymphatic: No significant vascular findings are present. No
enlarged abdominal or pelvic lymph nodes.

Reproductive: Prostate is unremarkable.

Other: No free air, free fluid, or intra-abdominal fluid collection.

Musculoskeletal: There are no acute or suspicious osseous
abnormalities.
IMPRESSION: 1. No bowel inflammation, no diverticular disease.
2. Moderate diffuse colonic stool burden with fecalization of distal
small bowel contents suggesting slow transit.
3. Hepatic steatosis.

## 2019-09-26 ENCOUNTER — Other Ambulatory Visit: Payer: Self-pay

## 2019-09-26 ENCOUNTER — Ambulatory Visit: Payer: Self-pay | Admitting: Gastroenterology

## 2019-09-26 ENCOUNTER — Encounter: Payer: Self-pay | Admitting: Gastroenterology

## 2019-09-26 VITALS — BP 120/68 | HR 63 | Temp 97.9°F | Ht 67.0 in | Wt 184.0 lb

## 2019-09-26 DIAGNOSIS — K219 Gastro-esophageal reflux disease without esophagitis: Secondary | ICD-10-CM

## 2019-09-26 DIAGNOSIS — K648 Other hemorrhoids: Secondary | ICD-10-CM

## 2019-09-26 DIAGNOSIS — K625 Hemorrhage of anus and rectum: Secondary | ICD-10-CM

## 2019-09-26 MED ORDER — OMEPRAZOLE 40 MG PO CPDR: DELAYED_RELEASE_CAPSULE | ORAL | 3 refills | Status: DC

## 2019-09-26 NOTE — Progress Notes (Signed)
Reviewed

## 2019-09-26 NOTE — Progress Notes (Signed)
09/26/2019 Jorge Randall [id number] [birth year]   HISTORY OF PRESENT ILLNESS: This is a pleasant 33 year old male who I saw back in August 2018 for complaints of constipation.  At that time I recommended he increase his fluid intake as well as increase the fiber in his diet.  He was also to start taking MiraLAX daily and increase to twice daily if needed.  He tells me that he took it for a while, but then he seemed to be moving his bowels well so he stopped it.  He says that his bowel movements continue to soft, but he comes here today with complaints of rectal bleeding.  He tells me that he has had one episode of rectal bleeding since I saw him 2 years ago.  This is described as bright red blood on the toilet paper and a small amount in the toilet bowl as well that occurred about 1 month ago.  He also has ongoing acid reflux issues, but symptoms are well controlled on omeprazole 20 mg daily.   History reviewed. No pertinent past medical history. Past Surgical History:  Procedure Laterality Date  . none      reports that he has never smoked. He has never used smokeless tobacco. He reports current alcohol use. He reports that he does not use drugs. family history is not on file. No Known Allergies    Outpatient Encounter Medications as of 09/26/2019  Medication Sig  . ibuprofen (ADVIL,MOTRIN) 600 MG tablet Take 1 tablet (600 mg total) by mouth every 6 (six) hours as needed for moderate pain.  [name] omeprazole (PRILOSEC OTC) 20 MG tablet Take 20 mg by mouth daily.   No facility-administered encounter medications on file as of 09/26/2019.      REVIEW OF SYSTEMS  : All other systems reviewed and negative except where noted in the History of Present Illness.   PHYSICAL EXAM: BP 120/68   Pulse 63   Temp 97.9 F (36.6 C)   Ht 5\' 7"  (1.702 m)   Wt 184 lb (83.5 kg)   BMI 28.82 kg/m  General: Well developed Hispanic male in no acute distress Head: Normocephalic and atraumatic  Eyes:  Sclerae anicteric, conjunctiva pink. Ears: Normal auditory acuity Lungs: Clear throughout to auscultation; no increased WOB. Heart: Regular rate and rhythm; no M/R/G. Abdomen: Soft, non-distended.  BS present.  Non-tender. Rectal:  No external abnormalities noted.  DRE revealed some soft brown stool in rectal vault.  Anoscopy attempt and hemorrhoids were noted, but visualization was impaired by stools so not completely evaluated well. Musculoskeletal: Symmetrical with no gross deformities  Skin: No lesions on visible extremities Extremities: No edema  Neurological: Alert oriented x 4, grossly non-focal Psychological:  Alert and cooperative. Normal mood and affect  ASSESSMENT AND PLAN: *Rectal bleeding:  Likely due to internal hemorrhoids that were seen on exam today.  He had one episode since I saw him 2 years ago.  This occurred about a month ago and resolved right away without recurrence.  Could consider use hydrocortisone suppositories or even banding if this continues to be a recurrent issue in the future, but will observe for now.  We also talked about keeping his stools soft to avoid constipation and straining. *GERD:  Well controlled on over-the-counter omeprazole 20 mg daily.  Will send a prescription to see if he is able to get this cheaper via that route.  **Patient is primarily Spanish-speaking so the entirety of the visit was performed via interpreter.  CC:  Salvadore Farber, FNP

## 2019-09-26 NOTE — Patient Instructions (Signed)
We have sent the following medications to your pharmacy for you to pick up at your convenience:  Omeprazole  

## 2019-10-23 ENCOUNTER — Telehealth: Payer: Self-pay | Admitting: Gastroenterology

## 2019-10-23 NOTE — Telephone Encounter (Signed)
Pt states that he has been experiencing more bleeding since the past 3 days. He has been also having the sensation of being full despite not having been eating much. He would like some advise.

## 2019-10-23 NOTE — Telephone Encounter (Signed)
Spoke with patient via [place] interpreter # [phone].  The pt complains of continued rectal bleeding and abd pain and bloating.  He has only used the suppositories for 1 day.  He says it did help some when he used it.  He is taking protonix for the bloating but says it is not working would like to try something else.  He was told about Pepcid from someone and wants to try it at bedtime.  I advised he could try Pepcid at bedtime with protonix as prescribed and also to continue to take suppositories.  He will keep bowels soft and call back if this does not help.  Will send to [facility] for review.

## 2019-10-26 NOTE — Telephone Encounter (Signed)
We did not start him on pantoprazole.  It is just omeprazole 20 mg daily.  I am fine with taking the Pepcid at bedtime or we could even increase his omeprazole to 40 mg daily.  I agree with keeping his bowel soft and using the suppositories for now.  Thank you,  Jess

## 2019-10-26 NOTE — Telephone Encounter (Signed)
Pt called stating that he is still suffering severe constipation. He states that medication only helps him in short term because he gets constipated again. He wants to know what else he can do and if he could have a colon. Pls call him .

## 2019-10-26 NOTE — Telephone Encounter (Signed)
The pt advised to call if he continues to have problems

## 2019-10-26 NOTE — Telephone Encounter (Signed)
Used interpreter services to call and message was left for the pt to return call

## 2019-10-29 NOTE — Telephone Encounter (Signed)
Call placed to pt via interpretor and message left on voice mail to return call.

## 2019-10-30 NOTE — Telephone Encounter (Signed)
Have tried to reach the pt on several occasions via interpreter with no response.  I will await further communication from the pt

## 2020-02-01 ENCOUNTER — Encounter: Payer: Self-pay | Admitting: Gastroenterology

## 2020-02-14 ENCOUNTER — Ambulatory Visit (AMBULATORY_SURGERY_CENTER): Payer: Self-pay | Admitting: *Deleted

## 2020-02-14 ENCOUNTER — Other Ambulatory Visit: Payer: Self-pay

## 2020-02-14 VITALS — Temp 98.0°F | Ht 67.0 in | Wt 195.0 lb

## 2020-02-14 DIAGNOSIS — K625 Hemorrhage of anus and rectum: Secondary | ICD-10-CM

## 2020-02-14 DIAGNOSIS — Z01818 Encounter for other preprocedural examination: Secondary | ICD-10-CM

## 2020-02-14 NOTE — Progress Notes (Signed)
Patient is here in-person for PV and with an interpreter=her temp 97.5. Patient denies any allergies to eggs or soy. Patient denies any problems with anesthesia/sedation. Patient denies any oxygen use at home. Patient denies taking any diet/weight loss medications or blood thinners. Patient is not being treated for MRSA or C-diff. COVID-19 screening test is on 3/12 at 820 am, the pt is aware. Pt is aware that care partner will wait in the car during procedure; if they feel like they will be too hot or cold to wait in the car; they may wait in the 4 th floor lobby. Patient is aware to bring only one care partner. We want them to wear a mask (we do not have any that we can provide them), practice social distancing, and we will check their temperatures when they get here.  I did remind the patient that their care partner needs to stay in the parking lot the entire time and have a cell phone available, we will call them when the pt is ready for discharge. Patient will wear mask into building.

## 2020-02-22 ENCOUNTER — Other Ambulatory Visit: Payer: Self-pay | Admitting: Gastroenterology

## 2020-02-22 ENCOUNTER — Ambulatory Visit (INDEPENDENT_AMBULATORY_CARE_PROVIDER_SITE_OTHER): Payer: Self-pay

## 2020-02-22 DIAGNOSIS — Z1159 Encounter for screening for other viral diseases: Secondary | ICD-10-CM

## 2020-02-22 LAB — SARS CORONAVIRUS 2 (TAT 6-24 HRS): SARS Coronavirus 2: NEGATIVE

## 2020-02-26 ENCOUNTER — Ambulatory Visit (AMBULATORY_SURGERY_CENTER): Payer: Self-pay | Admitting: Gastroenterology

## 2020-02-26 ENCOUNTER — Other Ambulatory Visit: Payer: Self-pay

## 2020-02-26 ENCOUNTER — Encounter: Payer: Self-pay | Admitting: Gastroenterology

## 2020-02-26 VITALS — BP 125/78 | HR 75 | Temp 97.1°F | Resp 14 | Ht 67.0 in | Wt 195.0 lb

## 2020-02-26 DIAGNOSIS — K625 Hemorrhage of anus and rectum: Secondary | ICD-10-CM

## 2020-02-26 DIAGNOSIS — D123 Benign neoplasm of transverse colon: Secondary | ICD-10-CM

## 2020-02-26 DIAGNOSIS — K635 Polyp of colon: Secondary | ICD-10-CM

## 2020-02-26 DIAGNOSIS — K5900 Constipation, unspecified: Secondary | ICD-10-CM

## 2020-02-26 DIAGNOSIS — D120 Benign neoplasm of cecum: Secondary | ICD-10-CM

## 2020-02-26 DIAGNOSIS — K640 First degree hemorrhoids: Secondary | ICD-10-CM

## 2020-02-26 DIAGNOSIS — K921 Melena: Secondary | ICD-10-CM

## 2020-02-26 MED ORDER — SODIUM CHLORIDE 0.9 % IV SOLN: 500.0000 mL | Freq: Once | INTRAVENOUS | Status: DC

## 2020-02-26 NOTE — Progress Notes (Signed)
Interpreter used today at the [facility] for this pt.  Interpreter's name is- Joaquim Lai

## 2020-02-26 NOTE — Progress Notes (Signed)
Called to room to assist during endoscopic procedure.  Patient ID and intended procedure confirmed with present staff. Received instructions for my participation in the procedure from the performing physician.  

## 2020-02-26 NOTE — Progress Notes (Signed)
Pt's states no medical or surgical changes since previsit or office visit.  [place]

## 2020-02-26 NOTE — Progress Notes (Signed)
A and O x3. Report to RN. Tolerated MAC anesthesia well.

## 2020-02-26 NOTE — Patient Instructions (Signed)
USTED TUVO UN PROCEDIMIENTO ENDOSCPICO HOY EN EL  ENDOSCOPY CENTER:   Lea el informe del procedimiento que se le entreg para cualquier pregunta especfica sobre lo que se Primary school teacher.  Si el informe del examen no responde a sus preguntas, por favor llame a su gastroenterlogo para aclararlo.  Si usted solicit que no se le den [employer] de lo que se Estate manager/land agent en su procedimiento al [employer] va a cuidar, entonces el informe del procedimiento se ha incluido en un sobre sellado para que usted lo revise despus cuando le sea ms conveniente.   LO QUE PUEDE ESPERAR: Algunas sensaciones de hinchazn en el abdomen.  Puede tener ms gases de lo normal.  El caminar puede ayudarle a eliminar el aire que se le puso en el tracto gastrointestinal durante el procedimiento y reducir la hinchazn.  Si le hicieron una endoscopia inferior (como una colonoscopia o una sigmoidoscopia flexible), podra notar manchas de sangre en las heces fecales o en el papel higinico.  Si se someti a una preparacin intestinal para su procedimiento, es posible que no tenga una evacuacin intestinal normal durante [employer].   Tenga en cuenta:  Es posible que note un poco de irritacin y congestin en la nariz o algn drenaje.  Esto es debido al oxgeno [employer] durante su procedimiento.  No hay que preocuparse y esto debe desaparecer ms o Scientist, research (medical).   SNTOMAS PARA REPORTAR INMEDIATAMENTE:  Despus de una endoscopia inferior (colonoscopia o sigmoidoscopia flexible):  Cantidades excesivas de sangre en las heces fecales  Sensibilidad significativa o empeoramiento de los dolores abdominales   Hinchazn aguda del abdomen que antes no tena   Fiebre de 100F o ms    Para asuntos urgentes o de Freight forwarder, puede comunicarse con un gastroenterlogo a cualquier hora llamando al [phone].  DIETA:  Recomendamos una comida pequea al principio, pero luego puede continuar con su dieta normal.  Tome  muchos lquidos, Teacher, adult education las bebidas alcohlicas durante 24 horas.    ACTIVIDAD:  Debe planear tomarse las cosas con calma por el resto del da y no debe CONDUCIR ni usar maquinaria pesada Programmer, applications (debido a los medicamentos de sedacin utilizados durante el examen).     SEGUIMIENTO: Nuestro personal llamar al nmero que aparece en su historial al siguiente da hbil de su procedimiento para ver cmo se siente y para responder cualquier pregunta o inquietud que pueda tener con respecto a la informacin que se le dio despus del procedimiento. Si no podemos contactarle, le dejaremos un mensaje.  Sin embargo, si se siente bien y no tiene Paediatric nurse, no es necesario que nos devuelva la llamada.  Asumiremos que ha regresado a sus actividades diarias normales sin incidentes. Si se le tomaron algunas biopsias, le contactaremos por telfono o por carta en las prximas 3 semanas.  Si no ha sabido [employer] biopsias en el transcurso de 3 semanas, por favor llmenos al [phone].   FIRMAS/CONFIDENCIALIDAD: Usted y/o el acompaante que le cuide han firmado documentos que se ingresarn en su historial mdico Emergency planning/management officer.  Estas firmas atestiguan el hecho de que la informacin anterior   Use fiber- Citrucel, Fibercon, Konsyl or Metamucil  Resume Miralax for constipation  Please read over internal hemorrhoid pamphlet- call office if you would like to proceed with this

## 2020-02-26 NOTE — Op Note (Signed)
[place] Patient Name: Jorge Randall Procedure Date: 02/26/2020 11:15 AM MRN: [id number] Endoscopist: Gerrit Heck , MD Age: 34 Referring MD:  Date of Birth: Nov 20, 1986 Gender: Male Account #: [id number] Procedure:                Colonoscopy Indications:              Hematochezia, Constipation Medicines:                Monitored Anesthesia Care Procedure:                Pre-Anesthesia Assessment:                           - Prior to the procedure, a History and Physical                            was performed, and patient medications and                            allergies were reviewed. The patient's tolerance of                            previous anesthesia was also reviewed. The risks                            and benefits of the procedure and the sedation                            options and risks were discussed with the patient.                            All questions were answered, and informed consent                            was obtained. Prior Anticoagulants: The patient has                            taken no previous anticoagulant or antiplatelet                            agents. ASA Grade Assessment: II - A patient with                            mild systemic disease. After reviewing the risks                            and benefits, the patient was deemed in                            satisfactory condition to undergo the procedure.                           After obtaining informed consent, the colonoscope  was passed under direct vision. Throughout the                            procedure, the patient's blood pressure, pulse, and                            oxygen saturations were monitored continuously. The                            Colonoscope was introduced through the anus and                            advanced to the the terminal ileum. The colonoscopy                            was performed without  difficulty. The patient                            tolerated the procedure well. The quality of the                            bowel preparation was adequate. The terminal ileum,                            ileocecal valve, appendiceal orifice, and rectum                            were photographed. Scope In: 11:27:16 AM Scope Out: 11:41:36 AM Scope Withdrawal Time: 0 hours 12 minutes 29 seconds  Total Procedure Duration: 0 hours 14 minutes 20 seconds  Findings:                 The perianal and digital rectal examinations were                            normal.                           Two sessile polyps were found in the cecum. The                            polyps were 1 to 2 mm in size. These polyps were                            removed with a cold biopsy forceps. Resection and                            retrieval were complete. Estimated blood loss was                            minimal.                           A 5 mm polyp was found in the transverse colon. The  polyp was sessile. The polyp was removed with a                            cold snare. Resection and retrieval were complete.                            Estimated blood loss was minimal.                           Non-bleeding internal hemorrhoids were found during                            retroflexion. The hemorrhoids were small.                           The terminal ileum appeared normal. Complications:            No immediate complications. Estimated Blood Loss:     Estimated blood loss was minimal. Impression:               - Two 1 to 2 mm polyps in the cecum, removed with a                            cold biopsy forceps. Resected and retrieved.                           - One 5 mm polyp in the transverse colon, removed                            with a cold snare. Resected and retrieved.                           - Non-bleeding internal hemorrhoids.                           - The  examined portion of the ileum was normal. Recommendation:           - Patient has a contact number available for                            emergencies. The signs and symptoms of potential                            delayed complications were discussed with the                            patient. Return to normal activities tomorrow.                            Written discharge instructions were provided to the                            patient.                           -  Resume previous diet.                           - Continue present medications.                           - Await pathology results.                           - Repeat colonoscopy for surveillance based on                            pathology results.                           - Return to GI clinic PRN.                           - Use fiber, for example Citrucel, Fibercon, Konsyl                            or Metamucil.                           - Resume Miralax for treatment of constipation.                           - Pending response to therapy, could consider                            additional testing as needed, to include Sitz                            marker study, ARM, etc.                           - Internal hemorrhoids were noted on this study and                            may be amenable to hemorrhoid band ligation. If you                            are interested in further treatment of these                            hemorrhoids with band ligation, please contact my                            clinic to set up an appointment for evaluation and                            treatment. Gerrit Heck, MD 02/26/2020 11:48:21 AM

## 2020-02-28 ENCOUNTER — Telehealth: Payer: Self-pay

## 2020-02-28 NOTE — Telephone Encounter (Signed)
  Follow up Call-  Call back number 02/26/2020  Post procedure Call Back phone  # [phone]  Permission to leave phone message Yes  Some recent data might be hidden     Patient questions:  Do you have a fever, pain , or abdominal swelling? No. Pain Score  0 *  Have you tolerated food without any problems? Yes.    Have you been able to return to your normal activities? Yes.    Do you have any questions about your discharge instructions: Diet   No. Medications  No. Follow up visit  No.  Do you have questions or concerns about your Care? No.  Actions: * If pain score is 4 or above: No action needed, pain <4. 1. Have you developed a fever since your procedure? no  2.   Have you had an respiratory symptoms (SOB or cough) since your procedure? no  3.   Have you tested positive for COVID 19 since your procedure no  4.   Have you had any family members/close contacts diagnosed with the COVID 19 since your procedure?  no   If yes to any of these questions please route to Joylene John, RN and Alphonsa Gin, Therapist, sports.

## 2020-02-28 NOTE — Telephone Encounter (Signed)
Unable to leave message- line busy.

## 2020-02-29 ENCOUNTER — Encounter: Payer: Self-pay | Admitting: Gastroenterology

## 2023-10-20 ENCOUNTER — Encounter: Payer: Self-pay | Admitting: Gastroenterology

## 2023-10-20 ENCOUNTER — Ambulatory Visit: Payer: Self-pay | Admitting: Gastroenterology

## 2023-10-20 VITALS — BP 118/80 | HR 68 | Ht 66.0 in | Wt 206.2 lb

## 2023-10-20 DIAGNOSIS — K648 Other hemorrhoids: Secondary | ICD-10-CM

## 2023-10-20 DIAGNOSIS — R12 Heartburn: Secondary | ICD-10-CM

## 2023-10-20 DIAGNOSIS — K5900 Constipation, unspecified: Secondary | ICD-10-CM

## 2023-10-20 DIAGNOSIS — K921 Melena: Secondary | ICD-10-CM

## 2023-10-20 DIAGNOSIS — Z860100 Personal history of colon polyps, unspecified: Secondary | ICD-10-CM

## 2023-10-20 DIAGNOSIS — K219 Gastro-esophageal reflux disease without esophagitis: Secondary | ICD-10-CM

## 2023-10-20 DIAGNOSIS — R194 Change in bowel habit: Secondary | ICD-10-CM

## 2023-10-20 MED ORDER — HYDROCORTISONE ACETATE 25 MG RE SUPP: 25.0000 mg | Freq: Every evening | RECTAL | 3 refills | Status: AC

## 2023-10-20 NOTE — Progress Notes (Signed)
Chief Complaint: Rectal bleeding, abdominal pain, increased gas   HPI:     Jorge Randall is a 37 y.o. male referred to the Gastroenterology Clinic for evaluation of rectal bleeding, abdominal pain, increased gas, waterbrash.  He was last seen by Doug Sou in 09/2019 for evaluation of rectal bleeding and constipation.  Hemorrhoids noted on anoscopy.  Started on MiraLAX for constipation and topical therapy for hemorrhoids. - 02/26/2020: Colonoscopy: 2 small cecal adenomas, 5 mm transverse colon sessile serrated polyp, small internal hemorrhoids.  Normal TI.  Recommended repeat in 3 years.  Has had recurrence of intermittent BRB on tissue paper for the last 1.5 months. Sxs are intermittent, every couple of weeks. No rectal pain, dyschezia.[name]   Has been having constipation for that same amount of time. Described as hard stools with straining to have BM, but still daily BM. Has not trialed any medications or significant dietary modifications for this.  Has had abdominal distention, bloating, generalized abdominal discomfort with these episodes.  Tends to go away with BM.   Separately, has been having intermittent waterbrash and sore throat.  Symptoms have been present for last 2-3 years.  No dysphagia, heartburn, regurgitation.  He was previously treated with omeprazole a few years ago, but he stopped as he felt this was not efficacious.  More recently was seen by ENT last month for evaluation of his chronic sore throat and tonsil stones.  Laryngoscopy was unremarkable.  Offered 2-month trial of omeprazole for possible GERD, but patient declined.  Not currently taking any acid suppression therapy. Stopped soda, greasy foods, energy drinks and no change in sxs.  Does not drink much water in a day.   No recent labs or abdominal imaging for review.  History obtained via Spanish interpreter in the room today.  Past Medical History:  Diagnosis Date   GERD (gastroesophageal reflux  disease)    HTN (hypertension)    Rectal bleeding      Past Surgical History:  Procedure Laterality Date   none     Family History  Problem Relation Age of Onset   Hypertension Father    Colon cancer Neg Hx    Esophageal cancer Neg Hx    Rectal cancer Neg Hx    Stomach cancer Neg Hx    Colon polyps Neg Hx    Social History   Tobacco Use   Smoking status: Never   Smokeless tobacco: Never  Vaping Use   Vaping status: Never Used  Substance Use Topics   Alcohol use: Not Currently    Comment: occasional   Drug use: No   No current outpatient medications on file.   No current facility-administered medications for this visit.   No Known Allergies   Review of Systems: All systems reviewed and negative except where noted in HPI.     Physical Exam:    Wt Readings from Last 3 Encounters:  10/20/23 206 lb 4 oz (93.6 kg)  02/26/20 195 lb (88.5 kg)  02/14/20 195 lb (88.5 kg)    BP 118/80 (BP Location: Left Arm, Patient Position: Sitting, Cuff Size: Large)   Pulse 68   Ht 5\' 6"  (1.676 m) Comment: height measured without shoes  Wt 206 lb 4 oz (93.6 kg)   BMI 33.29 kg/m  Constitutional:  Pleasant, in no acute distress. Psychiatric: Normal mood and affect. Behavior is normal. Cardiovascular: Normal rate, regular rhythm. No edema Pulmonary/chest: Effort normal and breath  sounds normal. No wheezing, rales or rhonchi. Abdominal: Mild generalized lower abdominal TTP without rebound, guarding, peritoneal signs.  Soft, nondistended. Bowel sounds active throughout.  Neurological: Alert and oriented to person place and time. Skin: Skin is warm and dry. No rashes noted. Rectal: Exam deferred by patient to time of colonoscopy.    ASSESSMENT AND PLAN;   1) Constipation 2) Change in bowel habits 3) Generalized abdominal pain - Start fiber supplement (i.e. Benefiber, Citrucel) - Increase daily water consumption with a goal of 64 ounces daily - If no significant improvement,  will start MiraLAX 1 cap daily  4) Hematochezia 5) Internal hemorrhoids Colonoscopy in 02/2020 with small internal hemorrhoids.  Recent hemorrhoidal symptoms with onset of constipation.  Treating constipation as above. - Start Anusol suppositories - Evaluate hemorrhoids at time of colonoscopy as below - Depending on response to treatment, can plan for hemorrhoid band ligation  6) History of colon polyps - Due for repeat colonoscopy for ongoing polyp surveillance - Scheduling colonoscopy today  7) Waterbrash Discussed possibility of atypical reflux.  No response to omeprazole a few years ago, but not entirely sure those were the same symptoms.  Discussed role/utility of repeat trial of PPI vs endoscopic evaluation, and he prefers the latter. - EGD to evaluate for erosive esophagitis, LES laxity, hiatal hernia - Will hold off on initiating new acid suppression therapy today pending endoscopic findings   The indications, risks, and benefits of EGD and colonoscopy were explained to the patient in detail. Risks include but are not limited to bleeding, perforation, adverse reaction to medications, and cardiopulmonary compromise. Sequelae include but are not limited to the possibility of surgery, hospitalization, and mortality. The patient verbalized understanding and wished to proceed. All questions answered, referred to scheduler and bowel prep ordered. Further recommendations pending results of the exam.      Verlin Dike Jolette Lana, DO, FACG  10/20/2023, 8:37 AM   No ref. provider found

## 2023-10-20 NOTE — Patient Instructions (Addendum)
_______________________________________________________  If your blood pressure at your visit was 140/90 or greater, please contact your primary care physician to follow up on this.  If you are age 38 or younger, your body mass index should be between 19-25. Your Body mass index is 33.29 kg/m. If this is out of the aformentioned range listed, please consider follow up with your Primary Care Provider.  ________________________________________________________  The [facility] providers would like to encourage you to use [facility] to communicate with providers for non-urgent requests or questions.  Due to long hold times on the telephone, sending your provider a message by [facility] may be a faster and more efficient way to get a response.  Please allow 48 business hours for a response.  Please remember that this is for non-urgent requests.  _______________________________________________________  We have sent the following medications to your pharmacy for you to pick up at your convenience:  START: Anusol suppositories at bedtime for 2 weeks.  Please purchase the following medications over the counter and take as directed:  START: Fiber supplement daily  INCREASE: Water intake to 64 ounces per day.  You have been scheduled for an endoscopy and colonoscopy. Please follow the written instructions given to you at your visit today.  Please pick up your prep supplies at the pharmacy within the next 1-3 days.  If you use inhalers (even only as needed), please bring them with you on the day of your procedure.  DO NOT TAKE 7 DAYS PRIOR TO TEST- Trulicity (dulaglutide) Ozempic, Wegovy (semaglutide) Mounjaro (tirzepatide) Bydureon Bcise (exanatide extended release)  DO NOT TAKE 1 DAY PRIOR TO YOUR TEST Rybelsus (semaglutide) Adlyxin (lixisenatide) Victoza (liraglutide) Byetta (exanatide) ___________________________________________________________________________  Due to recent changes in  healthcare laws, you may see the results of your imaging and laboratory studies on MyChart before your provider has had a chance to review them.  We understand that in some cases there may be results that are confusing or concerning to you. Not all laboratory results come back in the same time frame and the provider may be waiting for multiple results in order to interpret others.  Please give [place] 48 hours in order for your provider to thoroughly review all the results before contacting the office for clarification of your results.   It was a pleasure to see you today!  Vito Cirigliano, D.O.

## 2023-10-23 ENCOUNTER — Encounter: Payer: Self-pay | Admitting: Certified Registered Nurse Anesthetist

## 2023-10-28 ENCOUNTER — Ambulatory Visit (AMBULATORY_SURGERY_CENTER): Payer: Self-pay | Admitting: Gastroenterology

## 2023-10-28 ENCOUNTER — Encounter: Payer: Self-pay | Admitting: Gastroenterology

## 2023-10-28 ENCOUNTER — Telehealth: Payer: Self-pay

## 2023-10-28 VITALS — BP 129/73 | HR 55 | Temp 98.0°F | Resp 13 | Ht 66.0 in | Wt 206.4 lb

## 2023-10-28 DIAGNOSIS — K319 Disease of stomach and duodenum, unspecified: Secondary | ICD-10-CM

## 2023-10-28 DIAGNOSIS — Z09 Encounter for follow-up examination after completed treatment for conditions other than malignant neoplasm: Secondary | ICD-10-CM

## 2023-10-28 DIAGNOSIS — K641 Second degree hemorrhoids: Secondary | ICD-10-CM

## 2023-10-28 DIAGNOSIS — K219 Gastro-esophageal reflux disease without esophagitis: Secondary | ICD-10-CM

## 2023-10-28 DIAGNOSIS — R194 Change in bowel habit: Secondary | ICD-10-CM

## 2023-10-28 DIAGNOSIS — K921 Melena: Secondary | ICD-10-CM

## 2023-10-28 DIAGNOSIS — R12 Heartburn: Secondary | ICD-10-CM

## 2023-10-28 DIAGNOSIS — Z860100 Personal history of colon polyps, unspecified: Secondary | ICD-10-CM

## 2023-10-28 DIAGNOSIS — K5900 Constipation, unspecified: Secondary | ICD-10-CM

## 2023-10-28 DIAGNOSIS — K2970 Gastritis, unspecified, without bleeding: Secondary | ICD-10-CM

## 2023-10-28 MED ORDER — SODIUM CHLORIDE 0.9 % IV SOLN: 500.0000 mL | Freq: Once | INTRAVENOUS | Status: DC

## 2023-10-28 NOTE — Progress Notes (Signed)
GASTROENTEROLOGY PROCEDURE H&P NOTE   Primary Care Physician: Patient, No Pcp Per    Reason for Procedure:  Hematochezia, history of colon polyps, constipation, change in bowel habits, GERD  Plan:    EGD, colonoscopy  Patient is appropriate for endoscopic procedure(s) in the ambulatory ([facility]) setting.  The nature of the procedure, as well as the risks, benefits, and alternatives were carefully and thoroughly reviewed with the patient. Ample time for discussion and questions allowed. The patient understood, was satisfied, and agreed to proceed.     HPI: Jorge Randall is a 37 y.o. male who presents for EGD for evaluation of reflux symptoms along with colonoscopy for ongoing polyp surveillance and evaluation of hematochezia and change in bowel habits with constipation.  Patient was most recently seen in the Gastroenterology Clinic on 10/20/2023 by me.  No interval change in medical history since that appointment. Please refer to that note for full details regarding GI history and clinical presentation.   Past Medical History:  Diagnosis Date   GERD (gastroesophageal reflux disease)    HTN (hypertension)    Rectal bleeding     Past Surgical History:  Procedure Laterality Date   none      Prior to Admission medications   Medication Sig Start Date End Date Taking? Authorizing Provider  hydrocortisone (ANUSOL-HC) 25 MG suppository Place 1 suppository (25 mg total) rectally at bedtime. Use for 2 weeks 10/20/23   Arvel Oquinn, Verlin Dike, DO    Current Outpatient Medications  Medication Sig Dispense Refill   hydrocortisone (ANUSOL-HC) 25 MG suppository Place 1 suppository (25 mg total) rectally at bedtime. Use for 2 weeks 14 suppository 3   Current Facility-Administered Medications  Medication Dose Route Frequency Provider Last Rate Last Admin   0.9 %  sodium chloride infusion  500 mL Intravenous Once Deaundra Dupriest V, DO        Allergies as of 10/28/2023   (No Known Allergies)     Family History  Problem Relation Age of Onset   Hypertension Father    Colon cancer Neg Hx    Esophageal cancer Neg Hx    Rectal cancer Neg Hx    Stomach cancer Neg Hx    Colon polyps Neg Hx     Social History   Socioeconomic History   Marital status: Married    Spouse name: Not on file   Number of children: 2   Years of education: Not on file   Highest education level: Not on file  Occupational History   Occupation: Education administrator  Tobacco Use   Smoking status: Never   Smokeless tobacco: Never  Vaping Use   Vaping status: Never Used  Substance and Sexual Activity   Alcohol use: Not Currently    Comment: occasional   Drug use: No   Sexual activity: Not on file  Other Topics Concern   Not on file  Social History Narrative   ** Merged History Encounter **       Social Determinants of Health   Financial Resource Strain: Not on file  Food Insecurity: Not on file  Transportation Needs: Not on file  Physical Activity: Not on file  Stress: Not on file  Social Connections: Not on file  Intimate Partner Violence: Not on file    Physical Exam: Vital signs in last 24 hours: @BP  131/77   Pulse (!) 59   Temp 98 F (36.7 C) (Temporal)   Ht 5\' 6"  (1.676 m)   Wt 206 lb 6.4 oz (93.6  kg)   SpO2 96%   BMI 33.31 kg/m  GEN: NAD EYE: Sclerae anicteric ENT: MMM CV: Non-tachycardic Pulm: CTA b/l GI: Soft, NT/ND NEURO:  Alert & Oriented x 3   Doristine Locks, DO [facility]   10/28/2023 1:13 PM

## 2023-10-28 NOTE — Progress Notes (Signed)
Report given to PACU, vss 

## 2023-10-28 NOTE — Patient Instructions (Signed)
Colonoscopy:  Handout on hemorrhoids given to you today Handout on hemorrhoidal banding given to you   Upper Endoscopy :   Handout on Gastritis given to you today Await pathology results on stomach biopsies done    Resume previous diet and medications     YOU HAD AN ENDOSCOPIC PROCEDURE TODAY AT THE [facility]:   Refer to the procedure report that was given to you for any specific questions about what was found during the examination.  If the procedure report does not answer your questions, please call your gastroenterologist to clarify.  If you requested that your care partner not be given the details of your procedure findings, then the procedure report has been included in a sealed envelope for you to review at your convenience later.  YOU SHOULD EXPECT: Some feelings of bloating in the abdomen. Passage of more gas than usual.  Walking can help get rid of the air that was put into your GI tract during the procedure and reduce the bloating. If you had a lower endoscopy (such as a colonoscopy or flexible sigmoidoscopy) you may notice spotting of blood in your stool or on the toilet paper. If you underwent a bowel prep for your procedure, you may not have a normal bowel movement for a few days.  Please Note:  You might notice some irritation and congestion in your nose or some drainage.  This is from the oxygen used during your procedure.  There is no need for concern and it should clear up in a day or so.  SYMPTOMS TO REPORT IMMEDIATELY:  Following lower endoscopy (colonoscopy or flexible sigmoidoscopy):  Excessive amounts of blood in the stool  Significant tenderness or worsening of abdominal pains  Swelling of the abdomen that is new, acute  Fever of 100F or higher  Following upper endoscopy (EGD)  Vomiting of blood or coffee ground material  New chest pain or pain under the shoulder blades  Painful or persistently difficult swallowing  New shortness of  breath  Fever of 100F or higher  Black, tarry-looking stools  For urgent or emergent issues, a gastroenterologist can be reached at any hour by calling [phone]. Do not use MyChart messaging for urgent concerns.    DIET:  We do recommend a small meal at first, but then you may proceed to your regular diet.  Drink plenty of fluids but you should avoid alcoholic beverages for 24 hours.  ACTIVITY:  You should plan to take it easy for the rest of today and you should NOT DRIVE or use heavy machinery until tomorrow (because of the sedation medicines used during the test).    FOLLOW UP: Our staff will call the number listed on your records the next business day following your procedure.  We will call around 7:15- 8:00 am to check on you and address any questions or concerns that you may have regarding the information given to you following your procedure. If we do not reach you, we will leave a message.     If any biopsies were taken you will be contacted by phone or by letter within the next 1-3 weeks.  Please call us at [phone] if you have not heard about the biopsies in 3 weeks.    SIGNATURES/CONFIDENTIALITY: You and/or your care partner have signed paperwork which will be entered into your electronic medical record.  These signatures attest to the fact that that the information above on your After Visit Summary has been reviewed and  is understood.  Full responsibility of the confidentiality of this discharge information lies with you and/or your care-partner.

## 2023-10-28 NOTE — Progress Notes (Signed)
Called to room to assist during endoscopic procedure.  Patient ID and intended procedure confirmed with present staff. Received instructions for my participation in the procedure from the performing physician.  

## 2023-10-28 NOTE — Telephone Encounter (Signed)
Amber from [facility] returned my call and they do not do hemorrhoid banding under sedation.  She had no recommendations as to whom does this.    Please advise.

## 2023-10-28 NOTE — Op Note (Signed)
[facility] Patient Name: Jorge Randall Procedure Date: 10/28/2023 1:21 PM MRN: [id number] Endoscopist: Doristine Locks , MD, [id number] Age: 37 Referring MD:  Date of Birth: [birth year] Gender: Male Account #: [id number] Procedure:                Upper GI endoscopy Indications:              Suspected esophageal reflux Medicines:                Monitored Anesthesia Care Procedure:                Pre-Anesthesia Assessment:                           - Prior to the procedure, a History and Physical                            was performed, and patient medications and                            allergies were reviewed. The patient's tolerance of                            previous anesthesia was also reviewed. The risks                            and benefits of the procedure and the sedation                            options and risks were discussed with the patient.                            All questions were answered, and informed consent                            was obtained. Prior Anticoagulants: The patient has                            taken no anticoagulant or antiplatelet agents. ASA                            Grade Assessment: II - A patient with mild systemic                            disease. After reviewing the risks and benefits,                            the patient was deemed in satisfactory condition to                            undergo the procedure.                           After obtaining informed consent, the endoscope was  passed under direct vision. Throughout the                            procedure, the patient's blood pressure, pulse, and                            oxygen saturations were monitored continuously. The                            Olympus Scope [id number] was introduced through the                            mouth, and advanced to the second part of duodenum.                            The upper GI  endoscopy was accomplished without                            difficulty. The patient tolerated the procedure                            well. Scope In: Scope Out: Findings:                 The examined esophagus was normal.                           The Z-line was regular and was found 40 cm from the                            incisors.                           Scattered mild inflammation characterized by                            congestion (edema) and erythema was found in the                            gastric body and in the gastric antrum. Biopsies                            were taken with a cold forceps for Helicobacter                            pylori testing. Estimated blood loss was minimal.                           The examined duodenum was normal. Complications:            No immediate complications. Estimated Blood Loss:     Estimated blood loss was minimal. Impression:               - Normal esophagus.                           -  Z-line regular, 40 cm from the incisors.                           - Mild, scattered, non-ulcer gastritis. Biopsied.                           - Normal examined duodenum. Recommendation:           - Patient has a contact number available for                            emergencies. The signs and symptoms of potential                            delayed complications were discussed with the                            patient. Return to normal activities tomorrow.                            Written discharge instructions were provided to the                            patient.                           - Resume previous diet.                           - Continue present medications.                           - Await pathology results. Doristine Locks, MD 10/28/2023 1:49:42 PM

## 2023-10-28 NOTE — Op Note (Signed)
[facility] Patient Name: Jorge Randall Procedure Date: 10/28/2023 1:20 PM MRN: [id number] Endoscopist: Doristine Locks , MD, [id number] Age: 37 Referring MD:  Date of Birth: Oct 30, 1986 Gender: Male Account #: [id number] Procedure:                Colonoscopy Indications:              Surveillance: Personal history of adenomatous                            polyps on last colonoscopy 3 years ago                           02/26/2020: Colonoscopy: 2 small cecal adenomas, 5                            mm transverse colon sessile serrated polyp, small                            internal hemorrhoids. Normal TI.                           Incidentally, also with constipation, change in                            bowel habits, and intermittent hematochezia. Medicines:                Monitored Anesthesia Care Procedure:                Pre-Anesthesia Assessment:                           - Prior to the procedure, a History and Physical                            was performed, and patient medications and                            allergies were reviewed. The patient's tolerance of                            previous anesthesia was also reviewed. The risks                            and benefits of the procedure and the sedation                            options and risks were discussed with the patient.                            All questions were answered, and informed consent                            was obtained. Prior Anticoagulants: The patient has  taken no anticoagulant or antiplatelet agents. ASA                            Grade Assessment: II - A patient with mild systemic                            disease. After reviewing the risks and benefits,                            the patient was deemed in satisfactory condition to                            undergo the procedure.                           After obtaining informed consent, the  colonoscope                            was passed under direct vision. Throughout the                            procedure, the patient's blood pressure, pulse, and                            oxygen saturations were monitored continuously. The                            Olympus Scope SN: [id number] was introduced through                            the anus and advanced to the the terminal ileum.                            The colonoscopy was performed without difficulty.                            The patient tolerated the procedure well. The                            quality of the bowel preparation was good. The                            terminal ileum, ileocecal valve, appendiceal                            orifice, and rectum were photographed. Scope In: 1:33:30 PM Scope Out: 1:43:20 PM Scope Withdrawal Time: 0 hours 7 minutes 41 seconds  Total Procedure Duration: 0 hours 9 minutes 50 seconds  Findings:                 The perianal and digital rectal examinations were                            normal.  The entire colon appeared normal.                           Non-bleeding internal hemorrhoids were found during                            retroflexion. The hemorrhoids were small.                           The terminal ileum appeared normal. Complications:            No immediate complications. Estimated Blood Loss:     Estimated blood loss: none. Impression:               - The entire examined colon is normal.                           - Non-bleeding internal hemorrhoids.                           - The examined portion of the ileum was normal.                           - No specimens collected. Recommendation:           - Patient has a contact number available for                            emergencies. The signs and symptoms of potential                            delayed complications were discussed with the                            patient. Return to  normal activities tomorrow.                            Written discharge instructions were provided to the                            patient.                           - Resume previous diet.                           - Continue present medications.                           - Use fiber, for example Citrucel, Fibercon, Konsyl                            or Metamucil.                           - Repeat colonoscopy in 5 years for surveillance.                           -  Return to GI office PRN.                           - Internal hemorrhoids were noted on this study and                            may be amenable to hemorrhoid band ligation. If you                            are interested in further treatment of these                            hemorrhoids with band ligation, please contact my                            clinic to set up an appointment for evaluation and                            treatment. Doristine Locks, MD 10/28/2023 1:53:00 PM

## 2023-10-28 NOTE — Telephone Encounter (Signed)
-----   Message from Shellia Cleverly sent at 10/28/2023  2:43 PM EST ----- Can you send a referral for this patient to Dr. Delice Bison and [facility] for evaluation/treatment of hemorrhoids. He does not want banding here since we do not offer sedation for it. Maybe they can accommodate there.   Thanks.

## 2023-10-28 NOTE — Telephone Encounter (Signed)
Left message on Jorge Randall's voicemail at [facility] [phone]) to return call to further discuss new patient consult.  Advised Jorge Randall to return call to me.

## 2023-10-31 ENCOUNTER — Telehealth: Payer: Self-pay

## 2023-10-31 NOTE — Telephone Encounter (Signed)
Called patient via language line (spanish).  Unable to reach patient and his VM is not set up

## 2023-10-31 NOTE — Telephone Encounter (Signed)
  Follow up Call-     10/28/2023    1:08 PM  Call back number  Post procedure Call Back phone  # [phone]  Permission to leave phone message Yes     Patient questions:  Do you have a fever, pain , or abdominal swelling? No. Pain Score  0 *  Have you tolerated food without any problems? Yes.    Have you been able to return to your normal activities? Yes.    Do you have any questions about your discharge instructions: Diet   No. Medications  No. Follow up visit  No.  Do you have questions or concerns about your Care? No.  Actions: * If pain score is 4 or above: No action needed, pain <4.

## 2023-11-02 ENCOUNTER — Encounter: Payer: Self-pay | Admitting: Gastroenterology

## 2023-11-02 LAB — SURGICAL PATHOLOGY

## 2023-11-02 NOTE — Telephone Encounter (Signed)
Attempted to reach patient by phone but there was no answer and no voicemail has been set up.

## 2023-11-15 NOTE — Telephone Encounter (Signed)
Spoke with patient and made him aware of this information.  Patient stated he would like to hold off on any treatment at this time.  He will contact our office if his plans change.

## 2023-12-16 ENCOUNTER — Other Ambulatory Visit: Payer: Self-pay | Admitting: Otolaryngology

## 2023-12-26 ENCOUNTER — Other Ambulatory Visit: Payer: Self-pay | Admitting: Otolaryngology

## 2024-01-11 ENCOUNTER — Ambulatory Visit (HOSPITAL_BASED_OUTPATIENT_CLINIC_OR_DEPARTMENT_OTHER): Admit: 2024-01-11 | Payer: Self-pay | Admitting: Otolaryngology

## 2024-01-11 ENCOUNTER — Encounter (HOSPITAL_BASED_OUTPATIENT_CLINIC_OR_DEPARTMENT_OTHER): Payer: Self-pay

## 2024-01-11 SURGERY — TONSILLECTOMY
Anesthesia: General | Laterality: Bilateral

## 2024-10-05 ENCOUNTER — Encounter (HOSPITAL_BASED_OUTPATIENT_CLINIC_OR_DEPARTMENT_OTHER): Payer: Self-pay | Admitting: Emergency Medicine

## 2024-10-05 ENCOUNTER — Emergency Department (HOSPITAL_BASED_OUTPATIENT_CLINIC_OR_DEPARTMENT_OTHER): Payer: Self-pay

## 2024-10-05 ENCOUNTER — Emergency Department (HOSPITAL_BASED_OUTPATIENT_CLINIC_OR_DEPARTMENT_OTHER)
Admission: EM | Admit: 2024-10-05 | Discharge: 2024-10-05 | Disposition: A | Discharge status: Home / Self Care | Payer: Self-pay | Attending: Emergency Medicine | Admitting: Emergency Medicine

## 2024-10-05 ENCOUNTER — Other Ambulatory Visit: Payer: Self-pay

## 2024-10-05 DIAGNOSIS — F419 Anxiety disorder, unspecified: Secondary | ICD-10-CM

## 2024-10-05 DIAGNOSIS — I10 Essential (primary) hypertension: Secondary | ICD-10-CM | POA: Insufficient documentation

## 2024-10-05 DIAGNOSIS — F411 Generalized anxiety disorder: Secondary | ICD-10-CM

## 2024-10-05 DIAGNOSIS — F321 Major depressive disorder, single episode, moderate: Secondary | ICD-10-CM

## 2024-10-05 DIAGNOSIS — R0789 Other chest pain: Secondary | ICD-10-CM | POA: Insufficient documentation

## 2024-10-05 DIAGNOSIS — F410 Panic disorder [episodic paroxysmal anxiety] without agoraphobia: Secondary | ICD-10-CM

## 2024-10-05 DIAGNOSIS — R079 Chest pain, unspecified: Secondary | ICD-10-CM

## 2024-10-05 LAB — CBC WITH DIFFERENTIAL/PLATELET
Abs Immature Granulocytes: 0.02 K/uL (ref 0.00–0.07)
Basophils Absolute: 0.1 K/uL (ref 0.0–0.1)
Basophils Relative: 1 %
Eosinophils Absolute: 0.1 K/uL (ref 0.0–0.5)
Eosinophils Relative: 2 %
HCT: 47.2 % (ref 39.0–52.0)
Hemoglobin: 16.5 g/dL (ref 13.0–17.0)
Immature Granulocytes: 0 %
Lymphocytes Relative: 27 %
Lymphs Abs: 2.3 K/uL (ref 0.7–4.0)
MCH: 29.7 pg (ref 26.0–34.0)
MCHC: 35 g/dL (ref 30.0–36.0)
MCV: 84.9 fL (ref 80.0–100.0)
Monocytes Absolute: 0.7 K/uL (ref 0.1–1.0)
Monocytes Relative: 8 %
Neutro Abs: 5.3 K/uL (ref 1.7–7.7)
Neutrophils Relative %: 62 %
Platelets: 190 K/uL (ref 150–400)
RBC: 5.56 MIL/uL (ref 4.22–5.81)
RDW: 12.8 % (ref 11.5–15.5)
WBC: 8.5 K/uL (ref 4.0–10.5)
nRBC: 0 % (ref 0.0–0.2)

## 2024-10-05 LAB — TROPONIN T, HIGH SENSITIVITY
Troponin T High Sensitivity: <15 ng/L (ref 0–19)
Troponin T High Sensitivity: <15 ng/L (ref 0–19)

## 2024-10-05 LAB — BASIC METABOLIC PANEL WITH GFR
Anion gap: 14 (ref 5–15)
BUN: 9 mg/dL (ref 6–20)
CO2: 23 mmol/L (ref 22–32)
Calcium: 9.7 mg/dL (ref 8.9–10.3)
Chloride: 104 mmol/L (ref 98–111)
Creatinine, Ser: 1.01 mg/dL (ref 0.61–1.24)
GFR, Estimated: >60 mL/min (ref >60)
Glucose, Bld: 107 mg/dL — ABNORMAL HIGH (ref 70–99)
Potassium: 3.4 mmol/L — ABNORMAL LOW (ref 3.5–5.1)
Sodium: 141 mmol/L (ref 135–145)

## 2024-10-05 MED ORDER — HYDROXYZINE HCL 25 MG PO TABS: 25.0000 mg | ORAL_TABLET | Freq: Three times a day (TID) | ORAL | Status: DC | PRN

## 2024-10-05 MED ORDER — HYDROXYZINE HCL 25 MG PO TABS: 25.0000 mg | ORAL_TABLET | Freq: Four times a day (QID) | ORAL | 0 refills | Status: AC
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
## 2024-10-05 NOTE — ED Triage Notes (Signed)
 Pt c/o LT CP x 4; unable to provide specific descriptor; sts hands are trembling; NAD at this time
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
## 2024-12-24 ENCOUNTER — Emergency Department (HOSPITAL_BASED_OUTPATIENT_CLINIC_OR_DEPARTMENT_OTHER)
Admission: EM | Admit: 2024-12-24 | Discharge: 2024-12-25 | Disposition: A | Payer: Self-pay | Attending: Emergency Medicine | Admitting: Emergency Medicine

## 2024-12-24 ENCOUNTER — Other Ambulatory Visit: Payer: Self-pay

## 2024-12-24 ENCOUNTER — Encounter (HOSPITAL_BASED_OUTPATIENT_CLINIC_OR_DEPARTMENT_OTHER): Payer: Self-pay | Admitting: Emergency Medicine

## 2024-12-24 DIAGNOSIS — R1084 Generalized abdominal pain: Secondary | ICD-10-CM | POA: Insufficient documentation

## 2024-12-24 LAB — COMPREHENSIVE METABOLIC PANEL WITH GFR
ALT: 22 U/L (ref 0–44)
AST: 27 U/L (ref 15–41)
Albumin: 4.7 g/dL (ref 3.5–5.0)
Alkaline Phosphatase: 79 U/L (ref 38–126)
Anion gap: 11 (ref 5–15)
BUN: 15 mg/dL (ref 6–20)
CO2: 25 mmol/L (ref 22–32)
Calcium: 9.4 mg/dL (ref 8.9–10.3)
Chloride: 100 mmol/L (ref 98–111)
Creatinine, Ser: 0.82 mg/dL (ref 0.61–1.24)
GFR, Estimated: >60 mL/min
Glucose, Bld: 99 mg/dL (ref 70–99)
Potassium: 4 mmol/L (ref 3.5–5.1)
Sodium: 137 mmol/L (ref 135–145)
Total Bilirubin: 0.8 mg/dL (ref 0.0–1.2)
Total Protein: 7.8 g/dL (ref 6.5–8.1)

## 2024-12-24 LAB — URINALYSIS, ROUTINE W REFLEX MICROSCOPIC
Bilirubin Urine: NEGATIVE
Glucose, UA: NEGATIVE mg/dL
Ketones, ur: NEGATIVE mg/dL
Leukocytes,Ua: NEGATIVE
Nitrite: NEGATIVE
Protein, ur: NEGATIVE mg/dL
Specific Gravity, Urine: 1.015 (ref 1.005–1.030)
pH: 6.5 (ref 5.0–8.0)

## 2024-12-24 LAB — CBC
HCT: 48.3 % (ref 39.0–52.0)
Hemoglobin: 17.2 g/dL — ABNORMAL HIGH (ref 13.0–17.0)
MCH: 30 pg (ref 26.0–34.0)
MCHC: 35.6 g/dL (ref 30.0–36.0)
MCV: 84.3 fL (ref 80.0–100.0)
Platelets: 172 K/uL (ref 150–400)
RBC: 5.73 MIL/uL (ref 4.22–5.81)
RDW: 12.9 % (ref 11.5–15.5)
WBC: 13.3 K/uL — ABNORMAL HIGH (ref 4.0–10.5)
nRBC: 0 % (ref 0.0–0.2)

## 2024-12-24 LAB — URINALYSIS, MICROSCOPIC (REFLEX): WBC, UA: NONE SEEN WBC/hpf (ref 0–5)

## 2024-12-24 LAB — LIPASE, BLOOD: Lipase: 51 U/L (ref 11–51)

## 2024-12-24 NOTE — ED Triage Notes (Signed)
 Tele interpreter Reymundo [phone]- pt c/o abd pain since waking today, loss of appetite. Denies emesis/diarrhea/fever/known sick contact.

## 2024-12-25 ENCOUNTER — Emergency Department (HOSPITAL_BASED_OUTPATIENT_CLINIC_OR_DEPARTMENT_OTHER): Payer: Self-pay

## 2024-12-25 MED ORDER — MORPHINE SULFATE (PF) 4 MG/ML IV SOLN
4.0000 mg | Freq: Once | INTRAVENOUS | Status: AC
  Administered 2024-12-25: 4 mg via INTRAVENOUS
  Filled 2024-12-25: qty 1

## 2024-12-25 MED ORDER — SODIUM CHLORIDE 0.9 % IV BOLUS
500.0000 mL | Freq: Once | INTRAVENOUS | Status: AC
  Administered 2024-12-25: 500 mL via INTRAVENOUS

## 2024-12-25 MED ORDER — ONDANSETRON HCL 4 MG/2ML IJ SOLN
4.0000 mg | Freq: Once | INTRAMUSCULAR | Status: AC
  Administered 2024-12-25: 4 mg via INTRAVENOUS
  Filled 2024-12-25: qty 2

## 2024-12-25 MED ORDER — ONDANSETRON 4 MG PO TBDP: 4.0000 mg | ORAL_TABLET | Freq: Three times a day (TID) | ORAL | 0 refills | Status: AC | PRN

## 2024-12-25 MED ORDER — IOHEXOL 300 MG/ML  SOLN
100.0000 mL | Freq: Once | INTRAMUSCULAR | Status: AC | PRN
  Administered 2024-12-25: 100 mL via INTRAVENOUS

## 2024-12-25 NOTE — ED Provider Notes (Signed)
 " [place] [facility] Provider Note   CSN: [id number] Arrival date & time: 12/24/24  2000     Patient presents with: Abdominal Pain   Jorge Randall is a 39 y.o. male.   The history is provided by the patient. A language interpreter was used.  Abdominal Pain Mcadoo Kearley is a 39 y.o. male who presents to the Emergency Department complaining of abdominal pain.  He presents to the emergency department for evaluation of generalized abdominal pain that started when he woke.  Pain is worse over his central abdomen.  No associate fever, nausea, vomiting, diarrhea.  He did take a laxative this morning and did have a normal BM today.  No dysuria.  He has not eaten today secondary to the pain.  He has no known medical problems and takes no routine medications.      Prior to Admission medications  Medication Sig Start Date End Date Taking? Authorizing Provider  ondansetron  (ZOFRAN -ODT) 4 MG disintegrating tablet Take 1 tablet (4 mg total) by mouth every 8 (eight) hours as needed. 12/25/24  Yes Griselda Norris, MD  hydrocortisone  (ANUSOL -HC) 25 MG suppository Place 1 suppository (25 mg total) rectally at bedtime. Use for 2 weeks 10/20/23   Cirigliano, Vito V, DO  hydrOXYzine  (ATARAX ) 25 MG tablet Take 1 tablet (25 mg total) by mouth every 6 (six) hours. 10/05/24   Lenor Hollering, MD    Allergies: Patient has no known allergies.    Review of Systems  Gastrointestinal:  Positive for abdominal pain.  All other systems reviewed and are negative.   Updated Vital Signs BP 129/88   Pulse 79   Temp 98.6 F (37 C) (Oral)   Resp 17   Ht 5' 6 (1.676 m)   Wt 82 kg   SpO2 98%   BMI 29.18 kg/m   Physical Exam Vitals and nursing note reviewed.  Constitutional:      Appearance: He is well-developed.  HENT:     Head: Normocephalic and atraumatic.  Cardiovascular:     Rate and Rhythm: Normal rate and regular rhythm.     Heart sounds: No murmur  heard. Pulmonary:     Effort: Pulmonary effort is normal. No respiratory distress.     Breath sounds: Normal breath sounds.  Abdominal:     Palpations: Abdomen is soft.     Tenderness: There is no guarding or rebound.     Comments: Moderate LLQ tenderness, mild RLQ tenderness.   Musculoskeletal:        General: No tenderness.  Skin:    General: Skin is warm and dry.  Neurological:     Mental Status: He is alert and oriented to person, place, and time.  Psychiatric:        Behavior: Behavior normal.     (all labs ordered are listed, but only abnormal results are displayed) Labs Reviewed  CBC - Abnormal; Notable for the following components:      Result Value   WBC 13.3 (*)    Hemoglobin 17.2 (*)    All other components within normal limits  URINALYSIS, ROUTINE W REFLEX MICROSCOPIC - Abnormal; Notable for the following components:   Hgb urine dipstick TRACE (*)    All other components within normal limits  URINALYSIS, MICROSCOPIC (REFLEX) - Abnormal; Notable for the following components:   Bacteria, UA RARE (*)    All other components within normal limits  LIPASE, BLOOD  COMPREHENSIVE METABOLIC PANEL WITH GFR    EKG:  None  Radiology: CT ABDOMEN PELVIS W CONTRAST Result Date: 12/25/2024 EXAM: CT ABDOMEN AND PELVIS WITH CONTRAST 12/25/2024 01:34:25 AM TECHNIQUE: CT of the abdomen and pelvis was performed with the administration of 100 mL of iohexol  (OMNIPAQUE ) 300 MG/ML solution. Multiplanar reformatted images are provided for review. Automated exposure control, iterative reconstruction, and/or weight-based adjustment of the mA/kV was utilized to reduce the radiation dose to as low as reasonably achievable. COMPARISON: None available. CLINICAL HISTORY: LLQ abdominal pain. FINDINGS: LOWER CHEST: No acute abnormality. LIVER: Mild hepatic steatosis. GALLBLADDER AND BILE DUCTS: Gallbladder is unremarkable. No biliary ductal dilatation. SPLEEN: No acute abnormality. PANCREAS: No acute  abnormality. ADRENAL GLANDS: No acute abnormality. KIDNEYS, URETERS AND BLADDER: No stones in the kidneys or ureters. No hydronephrosis. No perinephric or periureteral stranding. Urinary bladder is unremarkable. GI AND BOWEL: Mild sigmoid diverticulosis without superimposed acute inflammatory change. Appendix normal. The stomach, small bowel, and large bowel are otherwise unremarkable. PERITONEUM AND RETROPERITONEUM: No ascites. No free air. VASCULATURE: Aorta is normal in caliber. LYMPH NODES: No lymphadenopathy. REPRODUCTIVE ORGANS: No acute abnormality. BONES AND SOFT TISSUES: No acute osseous abnormality. No focal soft tissue abnormality. IMPRESSION: 1. No acute findings. 2. Mild sigmoid diverticulosis without superimposed acute inflammatory change. 3. Mild hepatic steatosis. Electronically signed by: Dorethia Molt MD MD 12/25/2024 01:58 AM EST RP Workstation: [id number]     Procedures   Medications Ordered in the ED  morphine  (PF) 4 MG/ML injection 4 mg (4 mg Intravenous Given 12/25/24 0056)  sodium chloride  0.9 % bolus 500 mL (0 mLs Intravenous Stopped 12/25/24 0229)  ondansetron  (ZOFRAN ) injection 4 mg (4 mg Intravenous Given 12/25/24 0056)  iohexol  (OMNIPAQUE ) 300 MG/ML solution 100 mL (100 mLs Intravenous Contrast Given 12/25/24 0109)                                    Medical Decision Making Amount and/or Complexity of Data Reviewed Labs: ordered. Radiology: ordered.  Risk Prescription drug management.   Patient here for evaluation of lower abdominal pain, on examination this appears to be greatest over the left lower quadrant but he is mildly tender in the right lower quadrant as well.  CBC with mild leukocytosis.  CT abdomen pelvis was obtained, which is negative for acute abnormality, in particular it is negative for acute appendicitis as well as acute diverticulitis.  After medications in the emergency department patient is feeling improved.  He is able to tolerate p.o.  Discussed  with patient findings studies.  Feel he is stable for discharge home with outpatient follow-up and return precautions.     Final diagnoses:  Generalized abdominal pain    ED Discharge Orders          Ordered    ondansetron  (ZOFRAN -ODT) 4 MG disintegrating tablet  Every 8 hours PRN        12/25/24 0258               Griselda Norris, MD 12/25/24 [phone]  "

## 2024-12-25 NOTE — ED Notes (Signed)
